# Patient Record
Sex: Female | Born: 1975 | Race: White | Hispanic: No | Marital: Married | Smoking: Current every day smoker
Health system: Southern US, Community
[De-identification: ages and names within clinical notes are randomized; demographics above are authoritative.]

## PROBLEM LIST (undated history)

## (undated) DIAGNOSIS — R112 Nausea with vomiting, unspecified: Secondary | ICD-10-CM

## (undated) DIAGNOSIS — F32A Depression, unspecified: Secondary | ICD-10-CM

## (undated) DIAGNOSIS — E038 Other specified hypothyroidism: Secondary | ICD-10-CM

## (undated) DIAGNOSIS — Z87442 Personal history of urinary calculi: Secondary | ICD-10-CM

## (undated) DIAGNOSIS — I10 Essential (primary) hypertension: Secondary | ICD-10-CM

## (undated) DIAGNOSIS — F419 Anxiety disorder, unspecified: Secondary | ICD-10-CM

## (undated) DIAGNOSIS — Z9889 Other specified postprocedural states: Secondary | ICD-10-CM

## (undated) DIAGNOSIS — E063 Autoimmune thyroiditis: Secondary | ICD-10-CM

## (undated) DIAGNOSIS — F329 Major depressive disorder, single episode, unspecified: Secondary | ICD-10-CM

## (undated) DIAGNOSIS — J189 Pneumonia, unspecified organism: Secondary | ICD-10-CM

## (undated) HISTORY — DX: Other specified hypothyroidism: E03.8

## (undated) HISTORY — DX: Anxiety disorder, unspecified: F41.9

## (undated) HISTORY — DX: Depression, unspecified: F32.A

## (undated) HISTORY — DX: Autoimmune thyroiditis: E06.3

## (undated) HISTORY — DX: Major depressive disorder, single episode, unspecified: F32.9

---

## 1998-01-18 HISTORY — PX: LAPAROSCOPIC OOPHERECTOMY: SHX6507

## 1998-01-18 HISTORY — PX: OVARIAN CYST SURGERY: SHX726

## 2011-01-05 ENCOUNTER — Other Ambulatory Visit: Payer: Self-pay | Admitting: Family Medicine

## 2011-02-08 ENCOUNTER — Ambulatory Visit: Payer: Self-pay | Admitting: Family Medicine

## 2012-04-01 ENCOUNTER — Emergency Department (HOSPITAL_COMMUNITY)
Admission: EM | Admit: 2012-04-01 | Discharge: 2012-04-01 | Disposition: A | Discharge status: Home / Self Care | Payer: BC Managed Care – PPO | Attending: Emergency Medicine | Admitting: Emergency Medicine

## 2012-04-01 ENCOUNTER — Encounter (HOSPITAL_COMMUNITY): Payer: Self-pay | Admitting: *Deleted

## 2012-04-01 DIAGNOSIS — J029 Acute pharyngitis, unspecified: Secondary | ICD-10-CM | POA: Insufficient documentation

## 2012-04-01 DIAGNOSIS — H6690 Otitis media, unspecified, unspecified ear: Secondary | ICD-10-CM | POA: Insufficient documentation

## 2012-04-01 DIAGNOSIS — R059 Cough, unspecified: Secondary | ICD-10-CM | POA: Insufficient documentation

## 2012-04-01 DIAGNOSIS — H5789 Other specified disorders of eye and adnexa: Secondary | ICD-10-CM | POA: Insufficient documentation

## 2012-04-01 DIAGNOSIS — J3489 Other specified disorders of nose and nasal sinuses: Secondary | ICD-10-CM | POA: Insufficient documentation

## 2012-04-01 DIAGNOSIS — J329 Chronic sinusitis, unspecified: Secondary | ICD-10-CM | POA: Insufficient documentation

## 2012-04-01 DIAGNOSIS — I10 Essential (primary) hypertension: Secondary | ICD-10-CM | POA: Insufficient documentation

## 2012-04-01 HISTORY — DX: Essential (primary) hypertension: I10

## 2012-04-01 MED ORDER — GUAIFENESIN-CODEINE 100-10 MG/5ML PO SYRP: 10.0000 mL | ORAL_SOLUTION | Freq: Three times a day (TID) | ORAL | Status: DC | PRN

## 2012-04-01 MED ORDER — AZITHROMYCIN 250 MG PO TABS
500.0000 mg | ORAL_TABLET | Freq: Once | ORAL | Status: AC
  Administered 2012-04-01: 500 mg via ORAL
  Filled 2012-04-01: qty 2

## 2012-04-01 MED ORDER — TOBRAMYCIN 0.3 % OP SOLN
1.0000 [drp] | Freq: Once | OPHTHALMIC | Status: AC
  Administered 2012-04-01: 1 [drp] via OPHTHALMIC
  Filled 2012-04-01: qty 5

## 2012-04-01 MED ORDER — GUAIFENESIN-CODEINE 100-10 MG/5ML PO SOLN
10.0000 mL | Freq: Once | ORAL | Status: AC
  Administered 2012-04-01: 10 mL via ORAL
  Filled 2012-04-01: qty 10

## 2012-04-01 MED ORDER — AZITHROMYCIN 250 MG PO TABS: ORAL_TABLET | ORAL | Status: DC

## 2012-04-01 MED ORDER — ANTIPYRINE-BENZOCAINE 5.4-1.4 % OT SOLN
3.0000 [drp] | Freq: Once | OTIC | Status: AC
  Administered 2012-04-01: 3 [drp] via OTIC
  Filled 2012-04-01: qty 10

## 2012-04-01 NOTE — ED Provider Notes (Signed)
History     CSN: [id number]  Arrival date & time 04/01/12  2021   First MD Initiated Contact with Patient 04/01/12 2031      Chief Complaint  Patient presents with  . Otalgia  . Sore Throat    (Consider location/radiation/quality/duration/timing/severity/associated sxs/prior treatment) Patient is a 37 y.o. female presenting with ear pain. The history is provided by the patient.  Otalgia Location:  Bilateral Behind ear:  No abnormality Quality:  Pressure and sore Severity:  Mild Onset quality:  Gradual Duration:  1 week Timing:  Constant Progression:  Worsening Chronicity:  New Context: not direct blow, not foreign body in ear, not loud noise and no water in ear   Relieved by:  Nothing Worsened by:  Nothing tried Ineffective treatments:  None tried Associated symptoms: congestion, cough, rhinorrhea and sore throat   Associated symptoms: no abdominal pain, no diarrhea, no ear discharge, no fever, no headaches, no hearing loss, no neck pain, no rash, no tinnitus and no vomiting   Cough:    Cough characteristics:  Productive   Sputum characteristics:  Yellow   Severity:  Mild   Onset quality:  Gradual   Progression:  Unchanged   Chronicity:  New   Past Medical History  Diagnosis Date  . Hypertension     Past Surgical History  Procedure Laterality Date  . Ovarian cyst surgery      History reviewed. No pertinent family history.  History  Substance Use Topics  . Smoking status: Never Smoker   . Smokeless tobacco: Not on file  . Alcohol Use: No    OB History   Grav Para Term Preterm Abortions TAB SAB Ect Mult Living                  Review of Systems  Constitutional: Negative for fever, chills, activity change and appetite change.  HENT: Positive for ear pain, congestion, sore throat, rhinorrhea and sinus pressure. Negative for hearing loss, facial swelling, trouble swallowing, neck pain, neck stiffness, tinnitus and ear discharge.   Eyes: Negative for  visual disturbance.  Respiratory: Positive for cough. Negative for chest tightness, shortness of breath, wheezing and stridor.   Cardiovascular: Negative for chest pain.  Gastrointestinal: Negative for nausea, vomiting, abdominal pain and diarrhea.  Skin: Negative.  Negative for rash.  Neurological: Negative for dizziness, weakness, numbness and headaches.  Hematological: Negative for adenopathy.  Psychiatric/Behavioral: Negative for confusion.  All other systems reviewed and are negative.    Allergies  Review of patient's allergies indicates no known allergies.  Home Medications  No current outpatient prescriptions on file.  BP 143/106  Pulse 92  Temp(Src) 98.1 F (36.7 C) (Oral)  Resp 20  Ht 5\' 4"  (1.626 m)  Wt 245 lb (111.131 kg)  BMI 42.03 kg/m2  SpO2 100%  LMP 03/25/2012  Physical Exam  Nursing note and vitals reviewed. Constitutional: She is oriented to person, place, and time. She appears well-developed and well-nourished. No distress.  HENT:  Head: Normocephalic and atraumatic. No trismus in the jaw.  Right Ear: Tympanic membrane and ear canal normal.  Left Ear: Ear canal normal. There is tenderness. No drainage or swelling. No mastoid tenderness. Tympanic membrane is erythematous. No hemotympanum.  Nose: Right sinus exhibits no maxillary sinus tenderness. Left sinus exhibits maxillary sinus tenderness.  Mouth/Throat: Uvula is midline and mucous membranes are normal. No edematous. Posterior oropharyngeal edema and posterior oropharyngeal erythema present. No oropharyngeal exudate or tonsillar abscesses.  Eyes: EOM are normal. Pupils are  equal, round, and reactive to light. Left eye exhibits exudate. Left eye exhibits no chemosis, no discharge and no hordeolum. No foreign body present in the left eye. Left conjunctiva is injected. Left conjunctiva has no hemorrhage.  Neck: Normal range of motion. Neck supple.  Cardiovascular: Normal rate, regular rhythm, normal heart  sounds and intact distal pulses.   No murmur heard. Pulmonary/Chest: Effort normal and breath sounds normal. No respiratory distress. She has no wheezes. She has no rales. She exhibits no tenderness.  Musculoskeletal: Normal range of motion.  Lymphadenopathy:    She has no cervical adenopathy.  Neurological: She is alert and oriented to person, place, and time. She exhibits normal muscle tone. Coordination normal.  Skin: Skin is warm and dry.    ED Course  Procedures (including critical care time)  Labs Reviewed - No data to display No results found.      MDM   Airway patent.  Vitals stable.  Left OM and likely sinusitis.  PCN allergy, so will treat with z-pack and robitussin AC.  Pt agrees to fluids, ibuprofen if needed for fever.  F/u with her PMD  Patient also c/o crusting and drainage to the left eye, tobramycin ophth drops dispensed along with auralgan otic  The patient appears reasonably screened and/or stabilized for discharge and I doubt any other medical condition or other [facility] requiring further screening, evaluation, or treatment in the ED at this time prior to discharge.      Judye Lorino L. Trisha Mangle, PA-C 04/05/12 1411

## 2012-04-01 NOTE — ED Notes (Signed)
C/o right and left earache, sore throat earlier this week and sinus pressure, denies N/V/D, + cough- productive yellow/ green phlegm, denies fever

## 2012-04-05 NOTE — ED Provider Notes (Signed)
Medical screening examination/treatment/procedure(s) were performed by non-physician practitioner and as supervising physician I was immediately available for consultation/collaboration.   Carleene Cooper III, MD 04/05/12 2007

## 2012-09-28 ENCOUNTER — Encounter (HOSPITAL_COMMUNITY): Payer: Self-pay | Admitting: Emergency Medicine

## 2012-09-28 ENCOUNTER — Emergency Department (HOSPITAL_COMMUNITY): Payer: BC Managed Care – PPO

## 2012-09-28 ENCOUNTER — Emergency Department (HOSPITAL_COMMUNITY)
Admission: EM | Admit: 2012-09-28 | Discharge: 2012-09-28 | Disposition: A | Discharge status: Home / Self Care | Payer: BC Managed Care – PPO | Attending: Emergency Medicine | Admitting: Emergency Medicine

## 2012-09-28 DIAGNOSIS — Y929 Unspecified place or not applicable: Secondary | ICD-10-CM | POA: Insufficient documentation

## 2012-09-28 DIAGNOSIS — X58XXXA Exposure to other specified factors, initial encounter: Secondary | ICD-10-CM | POA: Insufficient documentation

## 2012-09-28 DIAGNOSIS — IMO0002 Reserved for concepts with insufficient information to code with codable children: Secondary | ICD-10-CM | POA: Insufficient documentation

## 2012-09-28 DIAGNOSIS — Z79899 Other long term (current) drug therapy: Secondary | ICD-10-CM | POA: Insufficient documentation

## 2012-09-28 DIAGNOSIS — I10 Essential (primary) hypertension: Secondary | ICD-10-CM | POA: Insufficient documentation

## 2012-09-28 DIAGNOSIS — Y939 Activity, unspecified: Secondary | ICD-10-CM | POA: Insufficient documentation

## 2012-09-28 DIAGNOSIS — Z880 Allergy status to penicillin: Secondary | ICD-10-CM | POA: Insufficient documentation

## 2012-09-28 DIAGNOSIS — S46912A Strain of unspecified muscle, fascia and tendon at shoulder and upper arm level, left arm, initial encounter: Secondary | ICD-10-CM

## 2012-09-28 DIAGNOSIS — R000 Tachycardia, unspecified: Secondary | ICD-10-CM | POA: Insufficient documentation

## 2012-09-28 MED ORDER — DICLOFENAC SODIUM 75 MG PO TBEC: 75.0000 mg | DELAYED_RELEASE_TABLET | Freq: Two times a day (BID) | ORAL | Status: DC

## 2012-09-28 MED ORDER — KETOROLAC TROMETHAMINE 10 MG PO TABS
10.0000 mg | ORAL_TABLET | Freq: Once | ORAL | Status: AC
  Administered 2012-09-28: 10 mg via ORAL
  Filled 2012-09-28: qty 1

## 2012-09-28 MED ORDER — ONDANSETRON HCL 4 MG PO TABS
4.0000 mg | ORAL_TABLET | Freq: Once | ORAL | Status: AC
  Administered 2012-09-28: 4 mg via ORAL
  Filled 2012-09-28: qty 1

## 2012-09-28 MED ORDER — HYDROCODONE-ACETAMINOPHEN 5-325 MG PO TABS
2.0000 | ORAL_TABLET | Freq: Once | ORAL | Status: AC
  Administered 2012-09-28: 2 via ORAL
  Filled 2012-09-28: qty 2

## 2012-09-28 MED ORDER — HYDROCODONE-ACETAMINOPHEN 5-325 MG PO TABS: 1.0000 | ORAL_TABLET | ORAL | Status: DC | PRN

## 2012-09-28 NOTE — ED Provider Notes (Signed)
CSN: [id number]     Arrival date & time 09/28/12  2111 History   First MD Initiated Contact with Patient 09/28/12 2251     Chief Complaint  Patient presents with  . Shoulder Pain   (Consider location/radiation/quality/duration/timing/severity/associated sxs/prior Treatment) HPI Comments: Patient states she's been having problems with her shoulder for approximately a week and a half. The pain got progressively worse today around 5 PM. Patient states she can hardly raise her arm without severe pain in the shoulder region and going through to her back. The patient denies any recent injury or trauma. There's been no recent operations. The patient has not been dropping objects. She has tried ibuprofen with only partial relief of her pain and discomfort. She presents now for evaluation concerning the shoulder pain.  Patient is a 37 y.o. female presenting with shoulder pain. The history is provided by the patient.  Shoulder Pain Pertinent negatives include no abdominal pain, arthralgias, chest pain, coughing or neck pain.    Past Medical History  Diagnosis Date  . Hypertension    Past Surgical History  Procedure Laterality Date  . Ovarian cyst surgery     No family history on file. History  Substance Use Topics  . Smoking status: Never Smoker   . Smokeless tobacco: Not on file  . Alcohol Use: No   OB History   Grav Para Term Preterm Abortions TAB SAB Ect Mult Living                 Review of Systems  Constitutional: Negative for activity change.       All ROS Neg except as noted in HPI  HENT: Negative for nosebleeds and neck pain.   Eyes: Negative for photophobia and discharge.  Respiratory: Negative for cough, shortness of breath and wheezing.   Cardiovascular: Negative for chest pain and palpitations.  Gastrointestinal: Negative for abdominal pain and blood in stool.  Genitourinary: Negative for dysuria, frequency and hematuria.  Musculoskeletal: Negative for back pain and  arthralgias.  Skin: Negative.   Neurological: Negative for dizziness, seizures and speech difficulty.  Psychiatric/Behavioral: Negative for hallucinations and confusion.    Allergies  Penicillins  Home Medications   Current Outpatient Rx  Name  Route  Sig  Dispense  Refill  . hydrochlorothiazide (HYDRODIURIL) 25 MG tablet   Oral   Take 25 mg by mouth daily.         Marland Kitchen levothyroxine (SYNTHROID, LEVOTHROID) 25 MCG tablet   Oral   Take 25 mcg by mouth daily before breakfast.         . metoprolol (LOPRESSOR) 50 MG tablet   Oral   Take 50 mg by mouth daily.         . sertraline (ZOLOFT) 100 MG tablet   Oral   Take 100 mg by mouth daily.         . diclofenac (VOLTAREN) 75 MG EC tablet   Oral   Take 1 tablet (75 mg total) by mouth 2 (two) times daily.   12 tablet   0   . HYDROcodone-acetaminophen (NORCO/VICODIN) 5-325 MG per tablet   Oral   Take 1 tablet by mouth every 4 (four) hours as needed for pain.   20 tablet   0    BP 151/119  Pulse 109  Temp(Src) 99.1 F (37.3 C) (Oral)  Resp 18  Ht 5\' 3"  (1.6 m)  Wt 228 lb (103.42 kg)  BMI 40.4 kg/m2  SpO2 97%  LMP 09/20/2012 Physical Exam  Nursing note and vitals reviewed. Constitutional: She is oriented to person, place, and time. She appears well-developed and well-nourished.  Non-toxic appearance.  HENT:  Head: Normocephalic.  Right Ear: Tympanic membrane and external ear normal.  Left Ear: Tympanic membrane and external ear normal.  Eyes: EOM and lids are normal. Pupils are equal, round, and reactive to light.  Neck: Normal range of motion. Neck supple. Carotid bruit is not present.  Cardiovascular: Regular rhythm, normal heart sounds, intact distal pulses and normal pulses.  Tachycardia present.   Pulmonary/Chest: Breath sounds normal. No respiratory distress.  Abdominal: Soft. Bowel sounds are normal. There is no tenderness. There is no guarding.  Musculoskeletal: Normal range of motion.  There is pain  with attempted range of motion of the left shoulder. There is pain with both aDduction and abduction. There is soreness of the posterior shoulder from the upper trapezius to just below the scapula. There no hot areas involving the shoulder.  There is full range of motion of the left elbow wrist and fingers. Capillary refill is less than 2 seconds.  Lymphadenopathy:       Head (right side): No submandibular adenopathy present.       Head (left side): No submandibular adenopathy present.    She has no cervical adenopathy.  Neurological: She is alert and oriented to person, place, and time. She has normal strength. No cranial nerve deficit or sensory deficit.  Skin: Skin is warm and dry.  Psychiatric: She has a normal mood and affect. Her speech is normal.    ED Course  Procedures (including critical care time) Labs Review Labs Reviewed - No data to display Imaging Review Dg Shoulder Left  09/28/2012   *RADIOLOGY REPORT*  Clinical Data: Older pain without injury.  LEFT SHOULDER - 2+ VIEW  Comparison: None.  Findings: No acute fracture or dislocation is noted.  No gross soft tissue abnormality is seen.  IMPRESSION: No acute abnormality noted.   Original Report Authenticated By: Alcide Clever, M.D.    MDM   1. Shoulder strain, left, initial encounter    **I have reviewed nursing notes, vital signs, and all appropriate lab and imaging results for this patient.* Patient states she's been having problems with her shoulder for approximately a week and a half. The pain got progressively worse today around 5 PM. She has both anterior and posterior shoulder pain on the left. Examination is suspicious for possible rotator cuff injury.  Patient is fitted with a arm sling. Prescription for diclofenac and Norco given to the patient. Patient will see orthopedics next week.   Kathie Dike, PA-C 09/28/12 2324

## 2012-09-28 NOTE — ED Notes (Signed)
Pt alert & oriented x4, stable gait. Patient given discharge instructions, paperwork & prescription(s). Patient  instructed to stop at the registration desk to finish any additional paperwork. Patient verbalized understanding. Pt left department w/ no further questions. 

## 2012-09-28 NOTE — ED Notes (Signed)
Patient c/o left shoulder pain since 5pm.  Patient denies any injury or trauma.

## 2012-10-02 NOTE — ED Provider Notes (Signed)
Medical screening examination/treatment/procedure(s) were performed by non-physician practitioner and as supervising physician I was immediately available for consultation/collaboration.  Geoffery Lyons, MD 10/02/12 1538

## 2012-10-12 ENCOUNTER — Ambulatory Visit (INDEPENDENT_AMBULATORY_CARE_PROVIDER_SITE_OTHER): Payer: BC Managed Care – PPO | Admitting: Otolaryngology

## 2012-10-12 DIAGNOSIS — J0100 Acute maxillary sinusitis, unspecified: Secondary | ICD-10-CM

## 2012-10-12 DIAGNOSIS — J310 Chronic rhinitis: Secondary | ICD-10-CM

## 2012-10-16 ENCOUNTER — Other Ambulatory Visit (INDEPENDENT_AMBULATORY_CARE_PROVIDER_SITE_OTHER): Payer: Self-pay | Admitting: Otolaryngology

## 2012-10-16 DIAGNOSIS — J329 Chronic sinusitis, unspecified: Secondary | ICD-10-CM

## 2012-10-19 ENCOUNTER — Other Ambulatory Visit (HOSPITAL_COMMUNITY): Payer: BC Managed Care – PPO

## 2012-10-20 ENCOUNTER — Ambulatory Visit (HOSPITAL_COMMUNITY)
Admission: RE | Admit: 2012-10-20 | Discharge: 2012-10-20 | Disposition: A | Discharge status: Home / Self Care | Payer: BC Managed Care – PPO | Source: Ambulatory Visit | Attending: Otolaryngology | Admitting: Otolaryngology

## 2012-10-20 DIAGNOSIS — R51 Headache: Secondary | ICD-10-CM | POA: Insufficient documentation

## 2012-10-20 DIAGNOSIS — J329 Chronic sinusitis, unspecified: Secondary | ICD-10-CM | POA: Insufficient documentation

## 2012-10-20 DIAGNOSIS — J3489 Other specified disorders of nose and nasal sinuses: Secondary | ICD-10-CM | POA: Insufficient documentation

## 2012-11-19 ENCOUNTER — Emergency Department (HOSPITAL_COMMUNITY)
Admission: EM | Admit: 2012-11-19 | Discharge: 2012-11-19 | Discharge status: Against Medical Advice | Payer: BC Managed Care – PPO | Attending: Emergency Medicine | Admitting: Emergency Medicine

## 2012-11-19 ENCOUNTER — Encounter (HOSPITAL_COMMUNITY): Payer: Self-pay | Admitting: Emergency Medicine

## 2012-11-19 DIAGNOSIS — R109 Unspecified abdominal pain: Secondary | ICD-10-CM | POA: Insufficient documentation

## 2012-11-19 DIAGNOSIS — I10 Essential (primary) hypertension: Secondary | ICD-10-CM | POA: Insufficient documentation

## 2012-11-19 NOTE — ED Notes (Signed)
No answer in triage waiting room.

## 2012-11-19 NOTE — ED Notes (Signed)
No answer in waiting room 

## 2012-11-19 NOTE — ED Notes (Signed)
Pt c/o left flank pain that started this am, denies any injury,

## 2013-08-28 ENCOUNTER — Other Ambulatory Visit (HOSPITAL_COMMUNITY): Payer: Self-pay | Admitting: Physician Assistant

## 2013-08-28 DIAGNOSIS — N6310 Unspecified lump in the right breast, unspecified quadrant: Secondary | ICD-10-CM

## 2013-09-11 ENCOUNTER — Other Ambulatory Visit (HOSPITAL_COMMUNITY): Payer: Self-pay | Admitting: Physician Assistant

## 2013-09-11 ENCOUNTER — Ambulatory Visit (HOSPITAL_COMMUNITY)
Admission: RE | Admit: 2013-09-11 | Discharge: 2013-09-11 | Disposition: A | Discharge status: Home / Self Care | Payer: BC Managed Care – PPO | Source: Ambulatory Visit | Attending: Physician Assistant | Admitting: Physician Assistant

## 2013-09-11 DIAGNOSIS — N63 Unspecified lump in unspecified breast: Secondary | ICD-10-CM | POA: Diagnosis not present

## 2013-09-11 DIAGNOSIS — N6310 Unspecified lump in the right breast, unspecified quadrant: Secondary | ICD-10-CM

## 2013-09-18 ENCOUNTER — Encounter (HOSPITAL_COMMUNITY): Payer: Self-pay

## 2013-09-18 ENCOUNTER — Ambulatory Visit (HOSPITAL_COMMUNITY)
Admission: RE | Admit: 2013-09-18 | Discharge: 2013-09-18 | Disposition: A | Discharge status: Home / Self Care | Payer: BC Managed Care – PPO | Source: Ambulatory Visit | Attending: Physician Assistant | Admitting: Physician Assistant

## 2013-09-18 ENCOUNTER — Other Ambulatory Visit (HOSPITAL_COMMUNITY): Payer: Self-pay | Admitting: Physician Assistant

## 2013-09-18 DIAGNOSIS — N6310 Unspecified lump in the right breast, unspecified quadrant: Secondary | ICD-10-CM

## 2013-09-18 DIAGNOSIS — N63 Unspecified lump in unspecified breast: Secondary | ICD-10-CM | POA: Diagnosis not present

## 2013-09-18 MED ORDER — LIDOCAINE HCL (PF) 2 % IJ SOLN: 10.0000 mL | Freq: Once | INTRAMUSCULAR | Status: AC

## 2013-09-18 MED ORDER — LIDOCAINE HCL (PF) 2 % IJ SOLN
INTRAMUSCULAR | Status: AC
  Administered 2013-09-18: 10 mL
  Filled 2013-09-18: qty 10

## 2013-09-18 NOTE — Discharge Instructions (Signed)
Breast Biopsy °Care After °These instructions give you information on caring for yourself after your procedure. Your doctor may also give you more specific instructions. Call your doctor if you have any problems or questions after your procedure. °HOME CARE °· Only take medicine as told by your doctor. °· Do not take aspirin. °· Keep your sutures (stitches) dry when bathing. °· Protect the biopsy area. Do not let the area get bumped. °· Avoid activities that could pull the biopsy site open until your doctor approves. This includes: °· Stretching. °· Reaching. °· Exercise. °· Sports. °· Lifting more than 3lb. °· Continue your normal diet. °· Wear a good support bra for as long as told by your doctor. °· Change any bandages (dressings) as told by your doctor. °· Do not drink alcohol while taking pain medicine. °· Keep all doctor visits as told. Ask when your test results will be ready. Make sure you get your test results. °GET HELP RIGHT AWAY IF:  °· You have a fever. °· You have more bleeding (more than a small spot) from the biopsy site. °· You have trouble breathing. °· You have yellowish-white fluid (pus) coming from the biopsy site. °· You have redness, puffiness (swelling), or more pain in the biopsy site. °· You have a bad smell coming from the biopsy site. °· Your biopsy site opens after sutures, staples, or sticky strips have been removed. °· You have a rash. °· You need stronger medicine. °MAKE SURE YOU: °· Understand these instructions. °· Will watch your condition. °· Will get help right away if you are not doing well or get worse. °Document Released: 10/31/2008 Document Revised: 03/29/2011 Document Reviewed: 02/14/2011 °Exit[employer]® Patient Information ©2015 [employer]. This information is not intended to replace advice given to you by your health care provider. Make sure you discuss any questions you have with your health care provider. ° °Breast Biopsy °A breast biopsy is a test during which a sample of  tissue is taken from your breast. The breast tissue is looked at under a microscope for cancer cells.  °BEFORE THE PROCEDURE °· Make plans to have someone drive you home after the test. °· Do not smoke for 2 weeks before the test. Stop smoking, if you smoke. °· Do not drink alcohol for 24 hours before the test. °· Wear a good support bra to the test. °PROCEDURE  °You may be given one of the following: °· A medicine to numb the breast area (local anesthetic). °· A medicine to make you fall asleep (general anesthetic). °There are different types of breast biopsies. They include: °· Fine-needle aspiration. °¨ A needle is put into the breast lump. °¨ The needle takes out fluid and cells from the lump. °¨ Ultrasound imaging may be used to help find the lump and to put the needle in the right spot. °· Core-needle biopsy. °¨ A needle is put into the breast lump. °¨ The needle is put in your breast 3-6 times. °¨ The needle removes breast tissue. °¨ An ultrasound image or X-ray is often used to find the right spot to put in the needle. °· Stereotactic biopsy. °¨ X-rays and a computer are used to study X-ray pictures of the breast lump. °¨ The computer finds where the needle needs to be put into the breast. °¨ Tissue samples are taken out. °· Vacuum-assisted biopsy. °¨ A small cut (incision) is made in your breast. °¨ A biopsy device is put through the cut and into the breast   tissue. °¨ The biopsy device draws abnormal breast tissue into the biopsy device. °¨ A large tissue sample is often removed. °¨ No stitches are needed. °· Ultrasound-guided core-needle biopsy. °¨ Ultrasound imaging helps guide the needle into the area of the breast that is not normal. °¨ A cut is made in the breast. The needle is put into the breast lump. °¨ Tissue samples are taken out. °· Open biopsy. °¨ A large cut is made in the breast. °¨ Your doctor will try to remove the whole breast lump or as much as possible. °All tissue, fluid, or cell samples  are looked at under a microscope.  °AFTER THE PROCEDURE °· You will be taken to an area to recover. You will be able to go home once you are doing well and are without problems. °· You may have bruising on your breast. This is normal. °· A pressure bandage (dressing) may be put on your breast for 24-48 hours. This type of bandage is wrapped tightly around your chest. It helps stop fluid from building up underneath tissues. °Document Released: 03/29/2011 Document Revised: 05/21/2013 Document Reviewed: 03/29/2011 °ExitCare® Patient Information ©2015 [employer]. This information is not intended to replace advice given to you by your health care provider. Make sure you discuss any questions you have with your health care provider. ° °

## 2015-03-01 ENCOUNTER — Encounter (HOSPITAL_COMMUNITY): Payer: Self-pay

## 2015-03-01 ENCOUNTER — Emergency Department (HOSPITAL_COMMUNITY): Payer: No Typology Code available for payment source

## 2015-03-01 ENCOUNTER — Emergency Department (HOSPITAL_COMMUNITY)
Admission: EM | Admit: 2015-03-01 | Discharge: 2015-03-02 | Disposition: A | Discharge status: Home / Self Care | Payer: No Typology Code available for payment source | Attending: Emergency Medicine | Admitting: Emergency Medicine

## 2015-03-01 DIAGNOSIS — J181 Lobar pneumonia, unspecified organism: Secondary | ICD-10-CM

## 2015-03-01 DIAGNOSIS — Z87442 Personal history of urinary calculi: Secondary | ICD-10-CM | POA: Insufficient documentation

## 2015-03-01 DIAGNOSIS — M791 Myalgia: Secondary | ICD-10-CM | POA: Insufficient documentation

## 2015-03-01 DIAGNOSIS — Z79899 Other long term (current) drug therapy: Secondary | ICD-10-CM | POA: Diagnosis not present

## 2015-03-01 DIAGNOSIS — Z880 Allergy status to penicillin: Secondary | ICD-10-CM | POA: Diagnosis not present

## 2015-03-01 DIAGNOSIS — H578 Other specified disorders of eye and adnexa: Secondary | ICD-10-CM | POA: Insufficient documentation

## 2015-03-01 DIAGNOSIS — J189 Pneumonia, unspecified organism: Secondary | ICD-10-CM | POA: Diagnosis not present

## 2015-03-01 DIAGNOSIS — R000 Tachycardia, unspecified: Secondary | ICD-10-CM | POA: Diagnosis not present

## 2015-03-01 DIAGNOSIS — R110 Nausea: Secondary | ICD-10-CM | POA: Diagnosis not present

## 2015-03-01 DIAGNOSIS — R0989 Other specified symptoms and signs involving the circulatory and respiratory systems: Secondary | ICD-10-CM | POA: Diagnosis not present

## 2015-03-01 DIAGNOSIS — R509 Fever, unspecified: Secondary | ICD-10-CM | POA: Diagnosis present

## 2015-03-01 DIAGNOSIS — I10 Essential (primary) hypertension: Secondary | ICD-10-CM | POA: Diagnosis not present

## 2015-03-01 MED ORDER — IPRATROPIUM-ALBUTEROL 0.5-2.5 (3) MG/3ML IN SOLN
3.0000 mL | Freq: Once | RESPIRATORY_TRACT | Status: AC
  Administered 2015-03-01: 3 mL via RESPIRATORY_TRACT
  Filled 2015-03-01: qty 3

## 2015-03-01 MED ORDER — HYDROCODONE-ACETAMINOPHEN 5-325 MG PO TABS: 1.0000 | ORAL_TABLET | Freq: Four times a day (QID) | ORAL | Status: DC | PRN

## 2015-03-01 MED ORDER — HYDROCOD POLST-CPM POLST ER 10-8 MG/5ML PO SUER
5.0000 mL | Freq: Once | ORAL | Status: AC
  Administered 2015-03-01: 5 mL via ORAL
  Filled 2015-03-01: qty 5

## 2015-03-01 MED ORDER — AZITHROMYCIN 250 MG PO TABS: ORAL_TABLET | ORAL | Status: DC

## 2015-03-01 MED ORDER — AZITHROMYCIN 250 MG PO TABS
500.0000 mg | ORAL_TABLET | Freq: Once | ORAL | Status: AC
  Administered 2015-03-02: 500 mg via ORAL
  Filled 2015-03-01: qty 2

## 2015-03-01 MED ORDER — ONDANSETRON 4 MG PO TBDP
4.0000 mg | ORAL_TABLET | Freq: Once | ORAL | Status: AC
Start: 2015-03-02 — End: 2015-03-02
  Administered 2015-03-02: 4 mg via ORAL
  Filled 2015-03-01: qty 1

## 2015-03-01 MED ORDER — ALBUTEROL SULFATE HFA 108 (90 BASE) MCG/ACT IN AERS
2.0000 | INHALATION_SPRAY | RESPIRATORY_TRACT | Status: DC | PRN
  Administered 2015-03-02: 2 via RESPIRATORY_TRACT
  Filled 2015-03-01: qty 6.7

## 2015-03-01 NOTE — ED Notes (Signed)
Hot and cold, head is pounding, I do not feel good at all. I think I have been running a fever per pt. Nauseated per pt. Aching all over.

## 2015-03-01 NOTE — ED Provider Notes (Signed)
CSN: [id number]     Arrival date & time 03/01/15  2054 History   First MD Initiated Contact with Patient 03/01/15 2236     Chief Complaint  Patient presents with  . Fever  . Generalized Body Aches     (Consider location/radiation/quality/duration/timing/severity/associated sxs/prior Treatment) Patient is a 40 y.o. female presenting with URI. The history is provided by the patient.  URI Presenting symptoms: congestion, cough, facial pain, fever and sore throat   Severity:  Moderate Onset quality:  Gradual Duration:  2 weeks Timing:  Constant Progression:  Worsening Chronicity:  New Relieved by:  Nothing Worsened by:  Nothing tried Ineffective treatments:  OTC medications Associated symptoms: headaches, myalgias, sinus pain and wheezing   Risk factors: sick contacts    Kristen Atkins is a 40 y.o. female who presents to the ED with cough, congestion, fever, chills, sore throat and aching all over. Her symptoms started 2 weeks ago and she went to her PCP and was Dx with sinus infection and given Rx for Amoxicillin, patient called back and told PCP that she is allergic to penicillin. She then called in another medication but the patient states she did not have the money to get it so she has had no antibiotic. Patient has been taking tylenol and ibuprofen without relief. Patient's husband has been sick with similar symptoms.   Past Medical History  Diagnosis Date  . Hypertension   . Kidney stones    Past Surgical History  Procedure Laterality Date  . Ovarian cyst surgery     No family history on file. Social History  Substance Use Topics  . Smoking status: Never Smoker   . Smokeless tobacco: None  . Alcohol Use: No   OB History    No data available     Review of Systems  Constitutional: Positive for fever and chills.  HENT: Positive for congestion and sore throat.   Eyes: Positive for redness and itching. Negative for photophobia and visual disturbance.  Respiratory:  Positive for cough and wheezing.   Gastrointestinal: Positive for nausea. Negative for vomiting and diarrhea.  Genitourinary: Negative for dysuria, urgency, frequency and decreased urine volume.  Musculoskeletal: Positive for myalgias.  Skin: Negative for rash.  Neurological: Positive for headaches.  Psychiatric/Behavioral: Negative for confusion. The patient is not nervous/anxious.       Allergies  Penicillins  Home Medications   Prior to Admission medications   Medication Sig Start Date End Date Taking? Authorizing Provider  acetaminophen (TYLENOL) 500 MG tablet Take 2,000 mg by mouth every 6 (six) hours as needed for mild pain.   Yes Historical Provider, MD  fluticasone (FLONASE) 50 MCG/ACT nasal spray Place 1 spray into both nostrils daily as needed for allergies or rhinitis.   Yes Historical Provider, MD  hydrochlorothiazide (HYDRODIURIL) 25 MG tablet Take 25 mg by mouth daily.   Yes Historical Provider, MD  ibuprofen (ADVIL,MOTRIN) 200 MG tablet Take 800 mg by mouth every 6 (six) hours as needed for headache.   Yes Historical Provider, MD  levothyroxine (SYNTHROID, LEVOTHROID) 150 MCG tablet Take 150 mcg by mouth daily before breakfast.   Yes Historical Provider, MD  metoprolol (LOPRESSOR) 50 MG tablet Take 50 mg by mouth daily.   Yes Historical Provider, MD  azithromycin (ZITHROMAX) 250 MG tablet Take 1 tablet every day until finished. 03/01/15   Hope Bunnie Pion, NP  HYDROcodone-acetaminophen (NORCO/VICODIN) 5-325 MG tablet Take 1 tablet by mouth every 6 (six) hours as needed (for pain or cough).  03/01/15   Hope Bunnie Pion, NP   BP 120/65 mmHg  Pulse 92  Temp(Src) 100.2 F (37.9 C) (Oral)  Resp 16  Ht 5\' 3"  (1.6 m)  Wt 106.142 kg  BMI 41.46 kg/m2  SpO2 98%  LMP 02/27/2015 Physical Exam  Constitutional: She is oriented to person, place, and time. She appears well-developed and well-nourished. No distress.  HENT:  Head: Normocephalic and atraumatic.  Nose: Mucosal edema present.  Right sinus exhibits maxillary sinus tenderness. Left sinus exhibits maxillary sinus tenderness.  Eyes: EOM are normal.  Neck: Normal range of motion. Neck supple.  Cardiovascular: Regular rhythm.  Tachycardia present.   Pulmonary/Chest: Effort normal. Wheezes: occasional. She has rales in the left lower field.  Abdominal: Soft. Bowel sounds are normal. There is no tenderness.  Musculoskeletal: Normal range of motion.  Lymphadenopathy:    She has no cervical adenopathy.  Neurological: She is alert and oriented to person, place, and time. No cranial nerve deficit.  Skin: Skin is warm and dry.  Psychiatric: She has a normal mood and affect. Her behavior is normal.  Nursing note and vitals reviewed.   ED Course  Procedures (including critical care time) Labs Review Labs Reviewed - No data to display  Imaging Review Dg Chest 2 View  03/01/2015  CLINICAL DATA:  Acute onset of productive cough, shortness of breath and generalized chest pain. Dizziness. Fever, nausea and body aches. Initial encounter. EXAM: CHEST  2 VIEW COMPARISON:  None. FINDINGS: The lungs are well-aerated. Left basilar airspace opacity raises concern for pneumonia. Mild vascular congestion is noted. There is no evidence of pleural effusion or pneumothorax. The heart is normal in size; the mediastinal contour is within normal limits. No acute osseous abnormalities are seen. IMPRESSION: Left basilar airspace opacity raises concern for pneumonia. Mild vascular congestion noted. Electronically Signed   By: Garald Balding M.D.   On: 03/01/2015 23:41    MDM  40 y.o. female with 2 week hx of cough, congestion, fever and chills stable for d/c and does not appear toxic. Improved with albuterol/atrovent neb treatment and Tussionex. Will treat with antibiotics for pneumonia. First dose of Zithromax given prior to d/c and albuterol inhaler to go home with patient. Discussed with the patient clinical and  X-ray findings and plan of care  and all questioned fully answered. She will return if any problems arise.   Final diagnoses:  Left lower lobe pneumonia        Ashley Murrain, NP 03/02/15 [id number]  Sherwood Gambler, MD 03/02/15 [phone]

## 2015-03-02 NOTE — Discharge Instructions (Signed)
Do not take the narcotic if driving as it will make you sleepy. Follow up with your doctor or return here for worsening symptoms.   Community-Acquired Pneumonia, Adult Pneumonia is an infection of the lungs. One type of pneumonia can happen while a person is in a hospital. A different type can happen when a person is not in a hospital (community-acquired pneumonia). It is easy for this kind to spread from person to person. It can spread to you if you breathe near an infected person who coughs or sneezes. Some symptoms include:  A dry cough.  A wet (productive) cough.  Fever.  Sweating.  Chest pain. HOME CARE  Take over-the-counter and prescription medicines only as told by your doctor.  Only take cough medicine if you are losing sleep.  If you were prescribed an antibiotic medicine, take it as told by your doctor. Do not stop taking the antibiotic even if you start to feel better.  Sleep with your head and neck raised (elevated). You can do this by putting a few pillows under your head, or you can sleep in a recliner.  Do not use tobacco products. These include cigarettes, chewing tobacco, and e-cigarettes. If you need help quitting, ask your doctor.  Drink enough water to keep your pee (urine) clear or pale yellow. A shot (vaccine) can help prevent pneumonia. Shots are often suggested for:  People older than 38 years of age.  People older than 38 years of age:  Who are having cancer treatment.  Who have long-term (chronic) lung disease.  Who have problems with their body's defense system (immune system). You may also prevent pneumonia if you take these actions:  Get the flu (influenza) shot every year.  Go to the dentist as often as told.  Wash your hands often. If soap and water are not available, use hand sanitizer. GET HELP IF:  You have a fever.  You lose sleep because your cough medicine does not help. GET HELP RIGHT AWAY IF:  You are short of breath and it  gets worse.  You have more chest pain.  Your sickness gets worse. This is very serious if:  You are an older adult.  Your body's defense system is weak.  You cough up blood.   This information is not intended to replace advice given to you by your health care provider. Make sure you discuss any questions you have with your health care provider.   Document Released: 06/23/2007 Document Revised: 09/25/2014 Document Reviewed: 05/01/2014 Elsevier Interactive Patient Education [employer].

## 2016-07-05 ENCOUNTER — Encounter: Payer: Self-pay | Admitting: Gastroenterology

## 2016-08-26 ENCOUNTER — Ambulatory Visit: Payer: No Typology Code available for payment source | Admitting: Nurse Practitioner

## 2016-09-15 ENCOUNTER — Ambulatory Visit (INDEPENDENT_AMBULATORY_CARE_PROVIDER_SITE_OTHER): Payer: Managed Care, Other (non HMO) | Admitting: Nurse Practitioner

## 2016-09-15 ENCOUNTER — Encounter: Payer: Self-pay | Admitting: Nurse Practitioner

## 2016-09-15 DIAGNOSIS — R197 Diarrhea, unspecified: Secondary | ICD-10-CM | POA: Diagnosis not present

## 2016-09-15 NOTE — Patient Instructions (Signed)
1. The taking her medications. 2. Continue to avoid sodas and increase her water. 3. Return for follow-up in 3 months. 4. Call us if you have any persons or concerns before then.

## 2016-09-15 NOTE — Progress Notes (Signed)
Primary Care Physician:  Barry Dienes, NP Primary Gastroenterologist:  Dr. Oneida Alar  Chief Complaint  Patient presents with  . Diarrhea    doing better since taking Align and cut back on [place]. Dew    HPI:   Kristen Atkins is a 41 y.o. female who presents on referral from PCP for Diarrhea, question celiac disease versus IBS. Patient last saw primary care on 06/21/2016 for abdominal pain. At that time she stated her abdominal pain lasted for 2 weeks and was associated with intermittent diarrhea. Her abdominal pain improves after bowel movement. Denies constipation or nausea but did have one episode of vomiting. History of anxiety which she states well controlled. Labs ordered include CBC which showed no leukocytosis, normal hemoglobin at 15.4 celiac antibody mostly normal with a mildly elevated IgA at 372 (high normal 352). TSH was elevated at 8.440. CMP normal.  Today she states she's doing ok overall. She was having diarrhea when she was drinking a lot of [facility]. She is now drinking more water as well as trying Align probiotic, and this has resolved the diarrhea. Only time her abdomen hurts is when she's under a lot of stress and she has recently an antianxiety medication (Buspar) and was just started yesterday. Denies hematochezia, melena, N/V, fever, chills, unintentional weight loss, acute changes in bowel habits (other than resolution of constipation with dietary changes.) Denies chest pain, dyspnea, dizziness, lightheadedness, syncope, near syncope. Denies any other upper or lower GI symptoms.   Past Medical History:  Diagnosis Date  . Hypertension   . Kidney stones     Past Surgical History:  Procedure Laterality Date  . OVARIAN CYST SURGERY      Current Outpatient Prescriptions  Medication Sig Dispense Refill  . busPIRone (BUSPAR) 10 MG tablet Take 10 mg by mouth 2 (two) times daily.    Marland Kitchen CLONAZEPAM PO Take by mouth 2 (two) times daily. Pt unsure of strength    .  fluticasone (FLONASE) 50 MCG/ACT nasal spray Place 1 spray into both nostrils daily as needed for allergies or rhinitis.    . hydrochlorothiazide (HYDRODIURIL) 25 MG tablet Take 25 mg by mouth daily.    Marland Kitchen ibuprofen (ADVIL,MOTRIN) 200 MG tablet Take 800 mg by mouth every 6 (six) hours as needed for headache.    . levothyroxine (SYNTHROID, LEVOTHROID) 150 MCG tablet Take 150 mcg by mouth daily before breakfast.    . metoprolol (LOPRESSOR) 50 MG tablet Take 50 mg by mouth daily.     No current facility-administered medications for this visit.     Allergies as of 09/15/2016 - Review Complete 09/15/2016  Allergen Reaction Noted  . Penicillins Hives and Other (See Comments) 04/01/2012    Family History  Problem Relation Age of Onset  . Ovarian cancer Mother   . Colon cancer Neg Hx     Social History   Social History  . Marital status: Married    Spouse name: N/A  . Number of children: N/A  . Years of education: N/A   Occupational History  . Not on file.   Social History Main Topics  . Smoking status: Current Every Day Smoker    Packs/day: 0.50    Years: 6.00    Types: Cigarettes    Start date: 09/16/2010  . Smokeless tobacco: Never Used  . Alcohol use No  . Drug use: No  . Sexual activity: Not on file   Other Topics Concern  . Not on file   Social  History Narrative  . No narrative on file    Review of Systems: Complete ROS negative except as per HPI.    Physical Exam: BP (!) 146/105   Pulse 100   Temp 98.7 F (37.1 C) (Oral)   Ht 5\' 4"  (1.626 m)   Wt 243 lb (110.2 kg)   LMP 09/12/2016 (Exact Date)   BMI 41.71 kg/m  General:   Alert and oriented. Pleasant and cooperative. Well-nourished and well-developed.  Head:  Normocephalic and atraumatic. Eyes:  Without icterus, sclera clear and conjunctiva pink.  Ears:  Normal auditory acuity. Cardiovascular:  S1, S2 present without murmurs appreciated. Extremities without clubbing or edema. Respiratory:  Clear to  auscultation bilaterally. No wheezes, rales, or rhonchi. No distress.  Gastrointestinal:  +BS, rounded but soft, non-tender and non-distended. No HSM noted. No guarding or rebound. No masses appreciated.  Rectal:  Deferred  Musculoskalatal:  Symmetrical without gross deformities. Neurologic:  Alert and oriented x4;  grossly normal neurologically. Psych:  Alert and cooperative. Normal mood and affect. Heme/Lymph/Immune: No excessive bruising noted.    09/15/2016 3:52 PM   Disclaimer: This note was dictated with voice recognition software. Similar sounding words can inadvertently be transcribed and may not be corrected upon review.

## 2016-09-15 NOTE — Assessment & Plan Note (Signed)
The patient was referred for diarrhea with multiple possible etiologies including IBS with a history of stress and anxiety, celiac disease with a nonspecific elevation in one element of her celiac antibody panel, and thyroid mediated with an elevated TSH. However, the patient may dietary changes and specifically significantly reduce the amount of [facility] she is drinking an increased amount of water she is drinking. Since then her diarrhea has resolved. Starting align probiotic has also helped. At this point I recommend she continue her align, continue significant reduction and potentially cessation of sodas and increased water intake. Return for follow-up in 3 months. If she continues to do well she can likely be put on a "as needed" follow-up schedule. I have requested that she call with any questions, concerns, recurrent symptoms before her follow-up visit.

## 2016-09-16 ENCOUNTER — Encounter: Payer: Self-pay | Admitting: Gastroenterology

## 2016-09-16 NOTE — Progress Notes (Signed)
cc'ed to pcp °

## 2016-12-23 ENCOUNTER — Ambulatory Visit: Payer: No Typology Code available for payment source | Admitting: Nurse Practitioner

## 2017-06-14 ENCOUNTER — Encounter: Payer: Self-pay | Admitting: *Deleted

## 2017-06-29 ENCOUNTER — Encounter: Payer: Self-pay | Admitting: Advanced Practice Midwife

## 2017-06-29 ENCOUNTER — Ambulatory Visit (INDEPENDENT_AMBULATORY_CARE_PROVIDER_SITE_OTHER): Payer: Managed Care, Other (non HMO) | Admitting: Advanced Practice Midwife

## 2017-06-29 VITALS — BP 151/92 | HR 95 | Ht 64.0 in | Wt 232.0 lb

## 2017-06-29 DIAGNOSIS — N924 Excessive bleeding in the premenopausal period: Secondary | ICD-10-CM | POA: Diagnosis not present

## 2017-06-29 NOTE — Progress Notes (Signed)
[facility] Visit  Patient name: Kristen Atkins MRN [id number]  Date of birth: [birth year]  CC & HPI:  Kristen Atkins is a 42 y.o. Caucasian female presenting today for heavy painful and longer periods for the past 6 months or so.  Very worried because mom passed away of OV Ca in her 47's and it "started this way"  No children, does not want them.   LMP 5/16, feels like she's going to start soon.  Pertinent History Reviewed:  Medical & Surgical Hx:   Past Medical History:  Diagnosis Date  . Anxiety and depression   . Hypertension   . Hypothyroidism due to Hashimoto's thyroiditis   . Kidney stones    Past Surgical History:  Procedure Laterality Date  . OVARIAN CYST SURGERY     Family History  Problem Relation Age of Onset  . Ovarian cancer Mother   . Stroke Father   . Hypertension Father   . Colon cancer Neg Hx     Current Outpatient Medications:  .  ALPRAZolam (XANAX) 0.5 MG tablet, , Disp: , Rfl: 3 .  BELSOMRA 10 MG TABS, , Disp: , Rfl: 3 .  fluticasone (FLONASE) 50 MCG/ACT nasal spray, Place 1 spray into both nostrils daily as needed for allergies or rhinitis., Disp: , Rfl:  .  hydrochlorothiazide (HYDRODIURIL) 25 MG tablet, Take 25 mg by mouth daily., Disp: , Rfl:  .  ibuprofen (ADVIL,MOTRIN) 200 MG tablet, Take 800 mg by mouth every 6 (six) hours as needed for headache., Disp: , Rfl:  .  levothyroxine (SYNTHROID, LEVOTHROID) 200 MCG tablet, , Disp: , Rfl: 1 .  metoprolol (LOPRESSOR) 50 MG tablet, Take 50 mg by mouth daily., Disp: , Rfl:  .  busPIRone (BUSPAR) 10 MG tablet, Take 10 mg by mouth 2 (two) times daily., Disp: , Rfl:  .  CLONAZEPAM PO, Take by mouth 2 (two) times daily. Pt unsure of strength, Disp: , Rfl:  Social History: Reviewed -  reports that she has been smoking cigarettes.  She started smoking about 6 years ago. She has a 3.00 pack-year smoking history. She has never used smokeless tobacco.  Review of Systems:   Constitutional: Negative  for fever and chills Eyes: Negative for visual disturbances Respiratory: Negative for shortness of breath, dyspnea Cardiovascular: Negative for chest pain or palpitations  Gastrointestinal: Negative for vomiting, diarrhea and constipation; no abdominal pain Genitourinary: Negative for dysuria and urgency, vaginal irritation or itching Musculoskeletal: Negative for back pain, joint pain, myalgias  Neurological: Negative for dizziness and headaches    Objective Findings:    Physical Examination: Vitals:   06/29/17 1535  BP: (!) 151/92  Pulse: 95   General appearance - well appearing, and in no distress Mental status - alert, oriented to person, place, and time Chest:  Normal respiratory effort Heart - normal rate and regular rhythm Abdomen:  Soft, nontender Pelvic: right ovary surgically (cyst) absent.  Uterus /L ovary felt to be WNL but exam limited by habitus Musculoskeletal:  Normal range of motion without pain Extremities:  No edema    No results found for this or any previous visit (from the past 24 hour(s)).    Assessment & Plan:  A:   Wants to look into endo ablatiaon.   P:  Pelvic US/discuss EA w/L[facility]   No follow-ups on file.  Christin Fudge CNM 06/29/2017 3:52 PM

## 2017-07-05 ENCOUNTER — Other Ambulatory Visit: Payer: Self-pay | Admitting: Advanced Practice Midwife

## 2017-07-05 DIAGNOSIS — N924 Excessive bleeding in the premenopausal period: Secondary | ICD-10-CM

## 2017-07-12 ENCOUNTER — Other Ambulatory Visit: Payer: Managed Care, Other (non HMO)

## 2017-07-12 ENCOUNTER — Ambulatory Visit (HOSPITAL_COMMUNITY)
Admission: RE | Admit: 2017-07-12 | Discharge: 2017-07-12 | Disposition: A | Discharge status: Home / Self Care | Payer: Managed Care, Other (non HMO) | Source: Ambulatory Visit | Attending: Advanced Practice Midwife | Admitting: Advanced Practice Midwife

## 2017-07-12 ENCOUNTER — Ambulatory Visit (INDEPENDENT_AMBULATORY_CARE_PROVIDER_SITE_OTHER): Payer: Managed Care, Other (non HMO) | Admitting: Obstetrics & Gynecology

## 2017-07-12 ENCOUNTER — Encounter: Payer: Self-pay | Admitting: Obstetrics & Gynecology

## 2017-07-12 VITALS — BP 126/88 | HR 90 | Ht 64.0 in | Wt 231.0 lb

## 2017-07-12 DIAGNOSIS — N920 Excessive and frequent menstruation with regular cycle: Secondary | ICD-10-CM | POA: Diagnosis not present

## 2017-07-12 DIAGNOSIS — F17210 Nicotine dependence, cigarettes, uncomplicated: Secondary | ICD-10-CM

## 2017-07-12 DIAGNOSIS — D250 Submucous leiomyoma of uterus: Secondary | ICD-10-CM | POA: Diagnosis not present

## 2017-07-12 DIAGNOSIS — N858 Other specified noninflammatory disorders of uterus: Secondary | ICD-10-CM | POA: Diagnosis not present

## 2017-07-12 DIAGNOSIS — N924 Excessive bleeding in the premenopausal period: Secondary | ICD-10-CM | POA: Diagnosis present

## 2017-07-12 DIAGNOSIS — N946 Dysmenorrhea, unspecified: Secondary | ICD-10-CM | POA: Diagnosis not present

## 2017-07-12 MED ORDER — MEGESTROL ACETATE 40 MG PO TABS: ORAL_TABLET | ORAL | 3 refills | Status: DC

## 2017-07-12 NOTE — Progress Notes (Addendum)
Preoperative History and Physical  Kristen Atkins is a 42 y.o. No obstetric history on file. with Patient's last menstrual period was 07/01/2017. admitted for a hysteroscopic removal of submucosal myoma and endometrial ablation .  Due to extremely heavy periods and dysmenorrhea Soils, bleeds sometimes for 2 weeks at a time Wears tampons and pads together  PMH:    Past Medical History:  Diagnosis Date  . Anxiety and depression   . Hypertension   . Hypothyroidism due to Hashimoto's thyroiditis   . Kidney stones     PSH:     Past Surgical History:  Procedure Laterality Date  . OVARIAN CYST SURGERY      POb/GynH:      OB History   None     SH:   Social History   Tobacco Use  . Smoking status: Current Every Day Smoker    Packs/day: 0.50    Years: 6.00    Pack years: 3.00    Types: Cigarettes    Start date: 09/16/2010  . Smokeless tobacco: Never Used  Substance Use Topics  . Alcohol use: No  . Drug use: No    FH:    Family History  Problem Relation Age of Onset  . Ovarian cancer Mother   . Stroke Father   . Hypertension Father   . Colon cancer Neg Hx      Allergies:  Allergies  Allergen Reactions  . Penicillins Hives and Other (See Comments)    Childhood Allergy     Medications:       Current Outpatient Medications:  .  ALPRAZolam (XANAX) 0.5 MG tablet, , Disp: , Rfl: 3 .  BELSOMRA 10 MG TABS, , Disp: , Rfl: 3 .  fluticasone (FLONASE) 50 MCG/ACT nasal spray, Place 1 spray into both nostrils daily as needed for allergies or rhinitis., Disp: , Rfl:  .  hydrochlorothiazide (HYDRODIURIL) 25 MG tablet, Take 25 mg by mouth daily., Disp: , Rfl:  .  ibuprofen (ADVIL,MOTRIN) 200 MG tablet, Take 800 mg by mouth every 6 (six) hours as needed for headache., Disp: , Rfl:  .  levothyroxine (SYNTHROID, LEVOTHROID) 200 MCG tablet, , Disp: , Rfl: 1 .  metoprolol (LOPRESSOR) 50 MG tablet, Take 50 mg by mouth daily., Disp: , Rfl:  .  busPIRone (BUSPAR) 10 MG tablet,  Take 10 mg by mouth 2 (two) times daily., Disp: , Rfl:  .  CLONAZEPAM PO, Take by mouth 2 (two) times daily. Pt unsure of strength, Disp: , Rfl:   Review of Systems:   Review of Systems  Constitutional: Negative for fever, chills, weight loss, malaise/fatigue and diaphoresis.  HENT: Negative for hearing loss, ear pain, nosebleeds, congestion, sore throat, neck pain, tinnitus and ear discharge.   Eyes: Negative for blurred vision, double vision, photophobia, pain, discharge and redness.  Respiratory: Negative for cough, hemoptysis, sputum production, shortness of breath, wheezing and stridor.   Cardiovascular: Negative for chest pain, palpitations, orthopnea, claudication, leg swelling and PND.  Gastrointestinal: Positive for abdominal pain. Negative for heartburn, nausea, vomiting, diarrhea, constipation, blood in stool and melena.  Genitourinary: Negative for dysuria, urgency, frequency, hematuria and flank pain.  Musculoskeletal: Negative for myalgias, back pain, joint pain and falls.  Skin: Negative for itching and rash.  Neurological: Negative for dizziness, tingling, tremors, sensory change, speech change, focal weakness, seizures, loss of consciousness, weakness and headaches.  Endo/Heme/Allergies: Negative for environmental allergies and polydipsia. Does not bruise/bleed easily.  Psychiatric/Behavioral: Negative for depression, suicidal ideas, hallucinations, memory loss  and substance abuse. The patient is not nervous/anxious and does not have insomnia.      PHYSICAL EXAM:  Blood pressure 126/88, pulse 90, height 5\' 4"  (1.626 m), weight 231 lb (104.8 kg), last menstrual period 07/01/2017.    Vitals reviewed. Constitutional: She is oriented to person, place, and time. She appears well-developed and well-nourished.  HENT:  Head: Normocephalic and atraumatic.  Right Ear: External ear normal.  Left Ear: External ear normal.  Nose: Nose normal.  Mouth/Throat: Oropharynx is clear  and moist.  Eyes: Conjunctivae and EOM are normal. Pupils are equal, round, and reactive to light. Right eye exhibits no discharge. Left eye exhibits no discharge. No scleral icterus.  Neck: Normal range of motion. Neck supple. No tracheal deviation present. No thyromegaly present.  Cardiovascular: Normal rate, regular rhythm, normal heart sounds and intact distal pulses.  Exam reveals no gallop and no friction rub.   No murmur heard. Respiratory: Effort normal and breath sounds normal. No respiratory distress. She has no wheezes. She has no rales. She exhibits no tenderness.  GI: Soft. Bowel sounds are normal. She exhibits no distension and no mass. There is tenderness. There is no rebound and no guarding.  Genitourinary:       Vulva is normal without lesions Vagina is pink moist without discharge Cervix normal in appearance and pap is normal Uterus is normal size, contour, position, consistency, mobility, non-tender Adnexa is negative with normal sized ovaries by sonogram  Musculoskeletal: Normal range of motion. She exhibits no edema and no tenderness.  Neurological: She is alert and oriented to person, place, and time. She has normal reflexes. She displays normal reflexes. No cranial nerve deficit. She exhibits normal muscle tone. Coordination normal.  Skin: Skin is warm and dry. No rash noted. No erythema. No pallor.  Psychiatric: She has a normal mood and affect. Her behavior is normal. Judgment and thought content normal.    Labs: No results found for this or any previous visit (from the past 336 hour(s)).  EKG: No orders found for this or any previous visit.  Imaging Studies: US Pelvis Transvanginal Non-ob (tv Only)  Result Date: 07/12/2017 CLINICAL DATA:  Patient with history of menorrhagia. Prior right oophorectomy. EXAM: TRANSABDOMINAL AND TRANSVAGINAL ULTRASOUND OF PELVIS TECHNIQUE: Both transabdominal and transvaginal ultrasound examinations of the pelvis were performed.  Transabdominal technique was performed for global imaging of the pelvis including uterus, ovaries, adnexal regions, and pelvic cul-de-sac. It was necessary to proceed with endovaginal exam following the transabdominal exam to visualize the endometrium. COMPARISON:  None FINDINGS: Uterus Measurements: 10.4 x 5.6 x 5.5 cm. Within the lower uterine segment there is a 3.0 x 2.5 x 3.5 cm hypoechoic mass. Endometrium Thickness: 8 mm. Hypoechoic mass measuring 3.0 x 2.5 x 3.5 cm within the lower uterine segment which may the impressing upon or within the endometrial canal. Right ovary Surgically absent Left ovary Measurements: 2.9 x 2.6 x 3.2 cm. Normal appearance/no adnexal mass. Other findings No abnormal free fluid. IMPRESSION: There is a 3.5 cm hypoechoic mass within the lower uterine segment which may either be within the endometrium or impressing upon the endometrium. Considerations include large submucosal fibroid or endometrial mass. Further evaluation can be obtained with sonohysterography or pelvic MRI. Electronically Signed   By: Lovey Newcomer M.D.   On: 07/12/2017 14:30   US Pelvis (transabdominal Only)  Result Date: 07/12/2017 CLINICAL DATA:  Patient with history of menorrhagia. Prior right oophorectomy. EXAM: TRANSABDOMINAL AND TRANSVAGINAL ULTRASOUND OF PELVIS TECHNIQUE: Both  transabdominal and transvaginal ultrasound examinations of the pelvis were performed. Transabdominal technique was performed for global imaging of the pelvis including uterus, ovaries, adnexal regions, and pelvic cul-de-sac. It was necessary to proceed with endovaginal exam following the transabdominal exam to visualize the endometrium. COMPARISON:  None FINDINGS: Uterus Measurements: 10.4 x 5.6 x 5.5 cm. Within the lower uterine segment there is a 3.0 x 2.5 x 3.5 cm hypoechoic mass. Endometrium Thickness: 8 mm. Hypoechoic mass measuring 3.0 x 2.5 x 3.5 cm within the lower uterine segment which may the impressing upon or within the  endometrial canal. Right ovary Surgically absent Left ovary Measurements: 2.9 x 2.6 x 3.2 cm. Normal appearance/no adnexal mass. Other findings No abnormal free fluid. IMPRESSION: There is a 3.5 cm hypoechoic mass within the lower uterine segment which may either be within the endometrium or impressing upon the endometrium. Considerations include large submucosal fibroid or endometrial mass. Further evaluation can be obtained with sonohysterography or pelvic MRI. Electronically Signed   By: Lovey Newcomer M.D.   On: 07/12/2017 14:30      Assessment: Submucous myoma of uterus  Menorrhagia with regular cycle  Dysmenorrhea    Plan: Hysteroscopic removal of submucosal myoma + Uterine curettage Minerva endometrial ablation  Florian Buff 07/12/2017 2:34 PM      Face to face time:  15 minutes  Greater than 50% of the visit time was spent in counseling and coordination of care with the patient.  The summary and outline of the counseling and care coordination is summarized in the note above.   All questions were answered.

## 2017-07-22 NOTE — Patient Instructions (Signed)
Kristen Atkins  07/22/2017     @PREFPERIOPPHARMACY @   Your procedure is scheduled on  08/03/2017   Report to [facility] at  1000   A.M.  Call this number if you have problems the morning of surgery:  [phone]   Remember:  Do not eat or drink after midnight.  You may drink clear liquids until  12 midnight 08/02/2017 .  Clear liquids allowed are:                    Water, Juice (non-citric and without pulp), Carbonated beverages, Clear Tea, Black Coffee only, Plain Jell-O only, Gatorade and Plain Popsicles only    Take these medicines the morning of surgery with A SIP OF WATER  Xanax, buspar, clonazepam, levothyroxine, metoprolol.    Do not wear jewelry, make-up or nail polish.  Do not wear lotions, powders, or perfumes, or deodorant.  Do not shave 48 hours prior to surgery.  Men may shave face and neck.  Do not bring valuables to the hospital.  [facility] is not responsible for any belongings or valuables.  Contacts, dentures or bridgework may not be worn into surgery.  Leave your suitcase in the car.  After surgery it may be brought to your room.  For patients admitted to the hospital, discharge time will be determined by your treatment team.  Patients discharged the day of surgery will not be allowed to drive home.   Name and phone number of your driver:   family Special instructions:  None  Please read over the following fact sheets that you were given. Anesthesia Post-op Instructions and Care and Recovery After Surgery       Dilation and Curettage or Vacuum Curettage Dilation and curettage (D&C) and vacuum curettage are minor procedures. A D&C involves stretching (dilation) the cervix and scraping (curettage) the inside lining of the uterus (endometrium). During a D&C, tissue is gently scraped from the endometrium, starting from the top portion of the uterus down to the lowest part of the uterus (cervix). During a vacuum curettage, the  lining and tissue in the uterus are removed with the use of gentle suction. Curettage may be performed to either diagnose or treat a problem. As a diagnostic procedure, curettage is performed to examine tissues from the uterus. A diagnostic curettage may be done if you have:  Irregular bleeding in the uterus.  Bleeding with the development of clots.  Spotting between menstrual periods.  Prolonged menstrual periods or other abnormal bleeding.  Bleeding after menopause.  No menstrual period (amenorrhea).  A change in size and shape of the uterus.  Abnormal endometrial cells discovered during a Pap test.  As a treatment procedure, curettage may be performed for the following reasons:  Removal of an IUD (intrauterine device).  Removal of retained placenta after giving birth.  Abortion.  Miscarriage.  Removal of endometrial polyps.  Removal of uncommon types of noncancerous lumps (fibroids).  Tell a health care provider about:  Any allergies you have, including allergies to prescribed medicine or latex.  All medicines you are taking, including vitamins, herbs, eye drops, creams, and over-the-counter medicines. This is especially important if you take any blood-thinning medicine. Bring a list of all of your medicines to your appointment.  Any problems you or family members have had with anesthetic medicines.  Any blood disorders you have.  Any surgeries you have had.  Your medical history and any medical conditions you have.  Whether you are pregnant or may be pregnant.  Recent vaginal infections you have had.  Recent menstrual periods, bleeding problems you have had, and what form of birth control (contraception) you use. What are the risks? Generally, this is a safe procedure. However, problems may occur, including:  Infection.  Heavy vaginal bleeding.  Allergic reactions to medicines.  Damage to the cervix or other structures or organs.  Development of scar  tissue (adhesions) inside the uterus, which can cause abnormal amounts of menstrual bleeding. This may make it harder to get pregnant in the future.  A hole (perforation) or puncture in the uterine wall. This is rare.  What happens before the procedure? Staying hydrated Follow instructions from your health care provider about hydration, which may include:  Up to 2 hours before the procedure - you may continue to drink clear liquids, such as water, clear fruit juice, black coffee, and plain tea.  Eating and drinking restrictions Follow instructions from your health care provider about eating and drinking, which may include:  8 hours before the procedure - stop eating heavy meals or foods such as meat, fried foods, or fatty foods.  6 hours before the procedure - stop eating light meals or foods, such as toast or cereal.  6 hours before the procedure - stop drinking milk or drinks that contain milk.  2 hours before the procedure - stop drinking clear liquids. If your health care provider told you to take your medicine(s) on the day of your procedure, take them with only a sip of water.  Medicines  Ask your health care provider about: ? Changing or stopping your regular medicines. This is especially important if you are taking diabetes medicines or blood thinners. ? Taking medicines such as aspirin and ibuprofen. These medicines can thin your blood. Do not take these medicines before your procedure if your health care provider instructs you not to.  You may be given antibiotic medicine to help prevent infection. General instructions  For 24 hours before your procedure, do not: ? Douche. ? Use tampons. ? Use medicines, creams, or suppositories in the vagina. ? Have sexual intercourse.  You may be given a pregnancy test on the day of the procedure.  Plan to have someone take you home from the hospital or clinic.  You may have a blood or urine sample taken.  If you will be going  home right after the procedure, plan to have someone with you for 24 hours. What happens during the procedure?  To reduce your risk of infection: ? Your health care team will wash or sanitize their hands. ? Your skin will be washed with soap.  An IV tube will be inserted into one of your veins.  You will be given one of the following: ? A medicine that numbs the area in and around the cervix (local anesthetic). ? A medicine to make you fall asleep (general anesthetic).  You will lie down on your back, with your feet in foot rests (stirrups).  The size and position of your uterus will be checked.  A lubricated instrument (speculum or Sims retractor) will be inserted into the back side of your vagina. The speculum will be used to hold apart the walls of your vagina so your health care provider can see your cervix.  A tool (tenaculum) will be attached to the lip of the cervix to stabilize it.  Your cervix will be softened and  dilated. This may be done by: ? Taking a medicine. ? Having tapered dilators or thin rods (laminaria) or gradual widening instruments (tapered dilators) inserted into your cervix.  A small, sharp, curved instrument (curette) will be used to scrape a small amount of tissue or cells from the endometrium or cervical canal. In some cases, gentle suction is applied with the curette. The curette will then be removed. The cells will be taken to a lab for testing. The procedure may vary among health care providers and hospitals. What happens after the procedure?  You may have mild cramping, backache, pain, and light bleeding or spotting. You may pass small blood clots from your vagina.  You may have to wear compression stockings. These stockings help to prevent blood clots and reduce swelling in your legs.  Your blood pressure, heart rate, breathing rate, and blood oxygen level will be monitored until the medicines you were given have worn off. Summary  Dilation and  curettage (D&C) involves stretching (dilation) the cervix and scraping (curettage) the inside lining of the uterus (endometrium).  After the procedure, you may have mild cramping, backache, pain, and light bleeding or spotting. You may pass small blood clots from your vagina.  Plan to have someone take you home from the hospital or clinic. This information is not intended to replace advice given to you by your health care provider. Make sure you discuss any questions you have with your health care provider. Document Released: 01/04/2005 Document Revised: 09/21/2015 Document Reviewed: 09/21/2015 Elsevier Interactive Patient Education  2018 [employer].  Dilation and Curettage or Vacuum Curettage, Care After These instructions give you information about caring for yourself after your procedure. Your doctor may also give you more specific instructions. Call your doctor if you have any problems or questions after your procedure. Follow these instructions at home: Activity  Do not drive or use heavy machinery while taking prescription pain medicine.  For 24 hours after your procedure, avoid driving.  Take short walks often, followed by rest periods. Ask your doctor what activities are safe for you. After one or two days, you may be able to return to your normal activities.  Do not lift anything that is heavier than 10 lb (4.5 kg) until your doctor approves.  For at least 2 weeks, or as long as told by your doctor: ? Do not douche. ? Do not use tampons. ? Do not have sex. General instructions  Take over-the-counter and prescription medicines only as told by your doctor. This is very important if you take blood thinning medicine.  Do not take baths, swim, or use a hot tub until your doctor approves. Take showers instead of baths.  Wear compression stockings as told by your doctor.  It is up to you to get the results of your procedure. Ask your doctor when your results will be  ready.  Keep all follow-up visits as told by your doctor. This is important. Contact a doctor if:  You have very bad cramps that get worse or do not get better with medicine.  You have very bad pain in your belly (abdomen).  You cannot drink fluids without throwing up (vomiting).  You get pain in a different part of the area between your belly and thighs (pelvis).  You have bad-smelling discharge from your vagina.  You have a rash. Get help right away if:  You are bleeding a lot from your vagina. A lot of bleeding means soaking more than one sanitary pad in  an hour, for 2 hours in a row.  You have clumps of blood (blood clots) coming from your vagina.  You have a fever or chills.  Your belly feels very tender or hard.  You have chest pain.  You have trouble breathing.  You cough up blood.  You feel dizzy.  You feel light-headed.  You pass out (faint).  You have pain in your neck or shoulder area. Summary  Take short walks often, followed by rest periods. Ask your doctor what activities are safe for you. After one or two days, you may be able to return to your normal activities.  Do not lift anything that is heavier than 10 lb (4.5 kg) until your doctor approves.  Do not take baths, swim, or use a hot tub until your doctor approves. Take showers instead of baths.  Contact your doctor if you have any symptoms of infection, like bad-smelling discharge from your vagina. This information is not intended to replace advice given to you by your health care provider. Make sure you discuss any questions you have with your health care provider. Document Released: 10/14/2007 Document Revised: 09/22/2015 Document Reviewed: 09/22/2015 Elsevier Interactive Patient Education  2017 [place]. Hysteroscopy Hysteroscopy is a procedure used for looking inside the womb (uterus). It may be done for various reasons, including:  To evaluate abnormal bleeding, fibroid (benign,  noncancerous) tumors, polyps, scar tissue (adhesions), and possibly cancer of the uterus.  To look for lumps (tumors) and other uterine growths.  To look for causes of why a woman cannot get pregnant (infertility), causes of recurrent loss of pregnancy (miscarriages), or a lost intrauterine device (IUD).  To perform a sterilization by blocking the fallopian tubes from inside the uterus.  In this procedure, a thin, flexible tube with a tiny light and camera on the end of it (hysteroscope) is used to look inside the uterus. A hysteroscopy should be done right after a menstrual period to be sure you are not pregnant. LET [facility] CARE PROVIDER KNOW ABOUT:  Any allergies you have.  All medicines you are taking, including vitamins, herbs, eye drops, creams, and over-the-counter medicines.  Previous problems you or members of your family have had with the use of anesthetics.  Any blood disorders you have.  Previous surgeries you have had.  Medical conditions you have. RISKS AND COMPLICATIONS Generally, this is a safe procedure. However, as with any procedure, complications can occur. Possible complications include:  Putting a hole in the uterus.  Excessive bleeding.  Infection.  Damage to the cervix.  Injury to other organs.  Allergic reaction to medicines.  Too much fluid used in the uterus for the procedure.  BEFORE THE PROCEDURE  Ask your health care provider about changing or stopping any regular medicines.  Do not take aspirin or blood thinners for 1 week before the procedure, or as directed by your health care provider. These can cause bleeding.  If you smoke, do not smoke for 2 weeks before the procedure.  In some cases, a medicine is placed in the cervix the day before the procedure. This medicine makes the cervix have a larger opening (dilate). This makes it easier for the instrument to be inserted into the uterus during the procedure.  Do not eat or drink  anything for at least 8 hours before the surgery.  Arrange for someone to take you home after the procedure. PROCEDURE  You may be given a medicine to relax you (sedative). You may also be  given one of the following: ? A medicine that numbs the area around the cervix (local anesthetic). ? A medicine that makes you sleep through the procedure (general anesthetic).  The hysteroscope is inserted through the vagina into the uterus. The camera on the hysteroscope sends a picture to a TV screen. This gives the surgeon a good view inside the uterus.  During the procedure, air or a liquid is put into the uterus, which allows the surgeon to see better.  Sometimes, tissue is gently scraped from inside the uterus. These tissue samples are sent to a lab for testing. What to expect after the procedure  If you had a general anesthetic, you may be groggy for a couple hours after the procedure.  If you had a local anesthetic, you will be able to go home as soon as you are stable and feel ready.  You may have some cramping. This normally lasts for a couple days.  You may have bleeding, which varies from light spotting for a few days to menstrual-like bleeding for 3-7 days. This is normal.  If your test results are not back during the visit, make an appointment with your health care provider to find out the results. This information is not intended to replace advice given to you by your health care provider. Make sure you discuss any questions you have with your health care provider. Document Released: 04/12/2000 Document Revised: 06/12/2015 Document Reviewed: 08/03/2012 Elsevier Interactive Patient Education  2017 [place]. Hysteroscopy, Care After Refer to this sheet in the next few weeks. These instructions provide you with information on caring for yourself after your procedure. Your health care provider may also give you more specific instructions. Your treatment has been planned according to  current medical practices, but problems sometimes occur. Call your health care provider if you have any problems or questions after your procedure. What can I expect after the procedure? After your procedure, it is typical to have the following:  You may have some cramping. This normally lasts for a couple days.  You may have bleeding. This can vary from light spotting for a few days to menstrual-like bleeding for 3-7 days.  Follow these instructions at home:  Rest for the first 1-2 days after the procedure.  Only take over-the-counter or prescription medicines as directed by your health care provider. Do not take aspirin. It can increase the chances of bleeding.  Take showers instead of baths for 2 weeks or as directed by your health care provider.  Do not drive for 24 hours or as directed.  Do not drink alcohol while taking pain medicine.  Do not use tampons, douche, or have sexual intercourse for 2 weeks or until your health care provider says it is okay.  Take your temperature twice a day for 4-5 days. Write it down each time.  Follow your health care provider's advice about diet, exercise, and lifting.  If you develop constipation, you may: ? Take a mild laxative if your health care provider approves. ? Add bran foods to your diet. ? Drink enough fluids to keep your urine clear or pale yellow.  Try to have someone with you or available to you for the first 24-48 hours, especially if you were given a general anesthetic.  Follow up with your health care provider as directed. Contact a health care provider if:  You feel dizzy or lightheaded.  You feel sick to your stomach (nauseous).  You have abnormal vaginal discharge.  You have a  rash.  You have pain that is not controlled with medicine. Get help right away if:  You have bleeding that is heavier than a normal menstrual period.  You have a fever.  You have increasing cramps or pain, not controlled with  medicine.  You have new belly (abdominal) pain.  You pass out.  You have pain in the tops of your shoulders (shoulder strap areas).  You have shortness of breath. This information is not intended to replace advice given to you by your health care provider. Make sure you discuss any questions you have with your health care provider. Document Released: 10/25/2012 Document Revised: 06/12/2015 Document Reviewed: 08/03/2012 Elsevier Interactive Patient Education  2017 [employer].  Endometrial Ablation Endometrial ablation is a procedure that destroys the thin inner layer of the lining of the uterus (endometrium). This procedure may be done:  To stop heavy periods.  To stop bleeding that is causing anemia.  To control irregular bleeding.  To treat bleeding caused by small tumors (fibroids) in the endometrium.  This procedure is often an alternative to major surgery, such as removal of the uterus and cervix (hysterectomy). As a result of this procedure:  You may not be able to have children. However, if you are premenopausal (you have not gone through menopause): ? You may still have a small chance of getting pregnant. ? You will need to use a reliable method of birth control after the procedure to prevent pregnancy.  You may stop having a menstrual period, or you may have only a small amount of bleeding during your period. Menstruation may return several years after the procedure.  Tell a health care provider about:  Any allergies you have.  All medicines you are taking, including vitamins, herbs, eye drops, creams, and over-the-counter medicines.  Any problems you or family members have had with the use of anesthetic medicines.  Any blood disorders you have.  Any surgeries you have had.  Any medical conditions you have. What are the risks? Generally, this is a safe procedure. However, problems may occur, including:  A hole (perforation) in the uterus or  bowel.  Infection of the uterus, bladder, or vagina.  Bleeding.  Damage to other structures or organs.  An air bubble in the lung (air embolus).  Problems with pregnancy after the procedure.  Failure of the procedure.  Decreased ability to diagnose cancer in the endometrium.  What happens before the procedure?  You will have tests of your endometrium to make sure there are no pre-cancerous cells or cancer cells present.  You may have an ultrasound of the uterus.  You may be given medicines to thin the endometrium.  Ask your health care provider about: ? Changing or stopping your regular medicines. This is especially important if you take diabetes medicines or blood thinners. ? Taking medicines such as aspirin and ibuprofen. These medicines can thin your blood. Do not take these medicines before your procedure if your doctor tells you not to.  Plan to have someone take you home from the hospital or clinic. What happens during the procedure?  You will lie on an exam table with your feet and legs supported as in a pelvic exam.  To lower your risk of infection: ? Your health care team will wash or sanitize their hands and put on germ-free (sterile) gloves. ? Your genital area will be washed with soap.  An IV tube will be inserted into one of your veins.  You will be given a  medicine to help you relax (sedative).  A surgical instrument with a light and camera (resectoscope) will be inserted into your vagina and moved into your uterus. This allows your surgeon to see inside your uterus.  Endometrial tissue will be removed using one of the following methods: ? Radiofrequency. This method uses a radiofrequency-alternating electric current to remove the endometrium. ? Cryotherapy. This method uses extreme cold to freeze the endometrium. ? Heated-free liquid. This method uses a heated saltwater (saline) solution to remove the endometrium. ? Microwave. This method uses high-energy  microwaves to heat up the endometrium and remove it. ? Thermal balloon. This method involves inserting a catheter with a balloon tip into the uterus. The balloon tip is filled with heated fluid to remove the endometrium. The procedure may vary among health care providers and hospitals. What happens after the procedure?  Your blood pressure, heart rate, breathing rate, and blood oxygen level will be monitored until the medicines you were given have worn off.  As tissue healing occurs, you may notice vaginal bleeding for 4-6 weeks after the procedure. You may also experience: ? Cramps. ? Thin, watery vaginal discharge that is light pink or brown in color. ? A need to urinate more frequently than usual. ? Nausea.  Do not drive for 24 hours if you were given a sedative.  Do not have sex or insert anything into your vagina until your health care provider approves. Summary  Endometrial ablation is done to treat the many causes of heavy menstrual bleeding.  The procedure may be done only after medications have been tried to control the bleeding.  Plan to have someone take you home from the hospital or clinic. This information is not intended to replace advice given to you by your health care provider. Make sure you discuss any questions you have with your health care provider. Document Released: 11/14/2003 Document Revised: 01/22/2016 Document Reviewed: 01/22/2016 Elsevier Interactive Patient Education  2017 [place] Anesthesia, Adult General anesthesia is the use of medicines to make a person "go to sleep" (be unconscious) for a medical procedure. General anesthesia is often recommended when a procedure:  Is long.  Requires you to be still or in an unusual position.  Is major and can cause you to lose blood.  Is impossible to do without general anesthesia.  The medicines used for general anesthesia are called general anesthetics. In addition to making you sleep, the  medicines:  Prevent pain.  Control your blood pressure.  Relax your muscles.  Tell a health care provider about:  Any allergies you have.  All medicines you are taking, including vitamins, herbs, eye drops, creams, and over-the-counter medicines.  Any problems you or family members have had with anesthetic medicines.  Types of anesthetics you have had in the past.  Any bleeding disorders you have.  Any surgeries you have had.  Any medical conditions you have.  Any history of heart or lung conditions, such as heart failure, sleep apnea, or chronic obstructive pulmonary disease (COPD).  Whether you are pregnant or may be pregnant.  Whether you use tobacco, alcohol, marijuana, or street drugs.  Any history of Armed forces logistics/support/administrative officer.  Any history of depression or anxiety. What are the risks? Generally, this is a safe procedure. However, problems may occur, including:  Allergic reaction to anesthetics.  Lung and heart problems.  Inhaling food or liquids from your stomach into your lungs (aspiration).  Injury to nerves.  Waking up during your procedure and being  unable to move (rare).  Extreme agitation or a state of mental confusion (delirium) when you wake up from the anesthetic.  Air in the bloodstream, which can lead to stroke.  These problems are more likely to develop if you are having a major surgery or if you have an advanced medical condition. You can prevent some of these complications by answering all of your health care provider's questions thoroughly and by following all pre-procedure instructions. General anesthesia can cause side effects, including:  Nausea or vomiting  A sore throat from the breathing tube.  Feeling cold or shivery.  Feeling tired, washed out, or achy.  Sleepiness or drowsiness.  Confusion or agitation.  What happens before the procedure? Staying hydrated Follow instructions from your health care provider about hydration, which  may include:  Up to 2 hours before the procedure - you may continue to drink clear liquids, such as water, clear fruit juice, black coffee, and plain tea.  Eating and drinking restrictions Follow instructions from your health care provider about eating and drinking, which may include:  8 hours before the procedure - stop eating heavy meals or foods such as meat, fried foods, or fatty foods.  6 hours before the procedure - stop eating light meals or foods, such as toast or cereal.  6 hours before the procedure - stop drinking milk or drinks that contain milk.  2 hours before the procedure - stop drinking clear liquids.  Medicines  Ask your health care provider about: ? Changing or stopping your regular medicines. This is especially important if you are taking diabetes medicines or blood thinners. ? Taking medicines such as aspirin and ibuprofen. These medicines can thin your blood. Do not take these medicines before your procedure if your health care provider instructs you not to. ? Taking new dietary supplements or medicines. Do not take these during the week before your procedure unless your health care provider approves them.  If you are told to take a medicine or to continue taking a medicine on the day of the procedure, take the medicine with sips of water. General instructions   Ask if you will be going home the same day, the following day, or after a longer hospital stay. ? Plan to have someone take you home. ? Plan to have someone stay with you for the first 24 hours after you leave the hospital or clinic.  For 3-6 weeks before the procedure, try not to use any tobacco products, such as cigarettes, chewing tobacco, and e-cigarettes.  You may brush your teeth on the morning of the procedure, but make sure to spit out the toothpaste. What happens during the procedure?  You will be given anesthetics through a mask and through an IV tube in one of your veins.  You may receive  medicine to help you relax (sedative).  As soon as you are asleep, a breathing tube may be used to help you breathe.  An anesthesia specialist will stay with you throughout the procedure. He or she will help keep you comfortable and safe by continuing to give you medicines and adjusting the amount of medicine that you get. He or she will also watch your blood pressure, pulse, and oxygen levels to make sure that the anesthetics do not cause any problems.  If a breathing tube was used to help you breathe, it will be removed before you wake up. The procedure may vary among health care providers and hospitals. What happens after the procedure?  You  will wake up, often slowly, after the procedure is complete, usually in a recovery area.  Your blood pressure, heart rate, breathing rate, and blood oxygen level will be monitored until the medicines you were given have worn off.  You may be given medicine to help you calm down if you feel anxious or agitated.  If you will be going home the same day, your health care provider may check to make sure you can stand, drink, and urinate.  Your health care providers will treat your pain and side effects before you go home.  Do not drive for 24 hours if you received a sedative.  You may: ? Feel nauseous and vomit. ? Have a sore throat. ? Have mental slowness. ? Feel cold or shivery. ? Feel sleepy. ? Feel tired. ? Feel sore or achy, even in parts of your body where you did not have surgery. This information is not intended to replace advice given to you by your health care provider. Make sure you discuss any questions you have with your health care provider. Document Released: 04/13/2007 Document Revised: 06/17/2015 Document Reviewed: 12/19/2014 Elsevier Interactive Patient Education  2018 [place] Anesthesia, Adult, Care After These instructions provide you with information about caring for yourself after your procedure. Your health  care provider may also give you more specific instructions. Your treatment has been planned according to current medical practices, but problems sometimes occur. Call your health care provider if you have any problems or questions after your procedure. What can I expect after the procedure? After the procedure, it is common to have:  Vomiting.  A sore throat.  Mental slowness.  It is common to feel:  Nauseous.  Cold or shivery.  Sleepy.  Tired.  Sore or achy, even in parts of your body where you did not have surgery.  Follow these instructions at home: For at least 24 hours after the procedure:  Do not: ? Participate in activities where you could fall or become injured. ? Drive. ? Use heavy machinery. ? Drink alcohol. ? Take sleeping pills or medicines that cause drowsiness. ? Make important decisions or sign legal documents. ? Take care of children on your own.  Rest. Eating and drinking  If you vomit, drink water, juice, or soup when you can drink without vomiting.  Drink enough fluid to keep your urine clear or pale yellow.  Make sure you have little or no nausea before eating solid foods.  Follow the diet recommended by your health care provider. General instructions  Have a responsible adult stay with you until you are awake and alert.  Return to your normal activities as told by your health care provider. Ask your health care provider what activities are safe for you.  Take over-the-counter and prescription medicines only as told by your health care provider.  If you smoke, do not smoke without supervision.  Keep all follow-up visits as told by your health care provider. This is important. Contact a health care provider if:  You continue to have nausea or vomiting at home, and medicines are not helpful.  You cannot drink fluids or start eating again.  You cannot urinate after 8-12 hours.  You develop a skin rash.  You have fever.  You have  increasing redness at the site of your procedure. Get help right away if:  You have difficulty breathing.  You have chest pain.  You have unexpected bleeding.  You feel that you are having a life-threatening or urgent  problem. This information is not intended to replace advice given to you by your health care provider. Make sure you discuss any questions you have with your health care provider. Document Released: 04/12/2000 Document Revised: 06/09/2015 Document Reviewed: 12/19/2014 Elsevier Interactive Patient Education  [employer].

## 2017-07-27 ENCOUNTER — Ambulatory Visit (HOSPITAL_COMMUNITY): Payer: Self-pay | Admitting: Hematology

## 2017-07-27 ENCOUNTER — Other Ambulatory Visit: Payer: Self-pay | Admitting: Obstetrics & Gynecology

## 2017-07-28 ENCOUNTER — Other Ambulatory Visit: Payer: Self-pay

## 2017-07-28 ENCOUNTER — Encounter (HOSPITAL_COMMUNITY): Payer: Self-pay

## 2017-07-28 ENCOUNTER — Encounter (HOSPITAL_COMMUNITY)
Admission: RE | Admit: 2017-07-28 | Discharge: 2017-07-28 | Disposition: A | Discharge status: Home / Self Care | Payer: Managed Care, Other (non HMO) | Source: Ambulatory Visit | Attending: Obstetrics & Gynecology | Admitting: Obstetrics & Gynecology

## 2017-07-28 DIAGNOSIS — I10 Essential (primary) hypertension: Secondary | ICD-10-CM | POA: Diagnosis not present

## 2017-07-28 DIAGNOSIS — Z01810 Encounter for preprocedural cardiovascular examination: Secondary | ICD-10-CM | POA: Diagnosis present

## 2017-07-28 DIAGNOSIS — Z01812 Encounter for preprocedural laboratory examination: Secondary | ICD-10-CM | POA: Diagnosis not present

## 2017-07-28 HISTORY — DX: Personal history of urinary calculi: Z87.442

## 2017-07-28 LAB — COMPREHENSIVE METABOLIC PANEL
ALBUMIN: 3.3 g/dL — AB (ref 3.5–5.0)
ALK PHOS: 67 U/L (ref 38–126)
ALT: 11 U/L (ref 0–44)
AST: 23 U/L (ref 15–41)
Anion gap: 10 (ref 5–15)
BUN: 17 mg/dL (ref 6–20)
CALCIUM: 8.7 mg/dL — AB (ref 8.9–10.3)
CO2: 19 mmol/L — ABNORMAL LOW (ref 22–32)
CREATININE: 0.81 mg/dL (ref 0.44–1.00)
Chloride: 109 mmol/L (ref 98–111)
GFR calc Af Amer: >60 mL/min (ref >60)
GFR calc non Af Amer: >60 mL/min (ref >60)
GLUCOSE: 108 mg/dL — AB (ref 70–99)
Potassium: 3.5 mmol/L (ref 3.5–5.1)
Sodium: 138 mmol/L (ref 135–145)
Total Bilirubin: 0.5 mg/dL (ref 0.3–1.2)
Total Protein: 7.7 g/dL (ref 6.5–8.1)

## 2017-07-28 LAB — URINALYSIS, ROUTINE W REFLEX MICROSCOPIC
BACTERIA UA: NONE SEEN
Bilirubin Urine: NEGATIVE
GLUCOSE, UA: NEGATIVE mg/dL
KETONES UR: NEGATIVE mg/dL
NITRITE: NEGATIVE
Protein, ur: NEGATIVE mg/dL
Specific Gravity, Urine: 1.014 (ref 1.005–1.030)
pH: 7 (ref 5.0–8.0)

## 2017-07-28 LAB — CBC
HCT: 42.7 % (ref 36.0–46.0)
HEMOGLOBIN: 14.6 g/dL (ref 12.0–15.0)
MCH: 29.3 pg (ref 26.0–34.0)
MCHC: 34.2 g/dL (ref 30.0–36.0)
MCV: 85.6 fL (ref 78.0–100.0)
Platelets: 402 10*3/uL — ABNORMAL HIGH (ref 150–400)
RBC: 4.99 MIL/uL (ref 3.87–5.11)
RDW: 14.6 % (ref 11.5–15.5)
WBC: 7.6 10*3/uL (ref 4.0–10.5)

## 2017-07-28 LAB — HCG, QUANTITATIVE, PREGNANCY: hCG, Beta Chain, Quant, S: <1 m[IU]/mL (ref <5)

## 2017-07-28 LAB — RAPID HIV SCREEN (HIV 1/2 AB+AG)
HIV 1/2 Antibodies: NONREACTIVE
HIV-1 P24 Antigen - HIV24: NONREACTIVE

## 2017-08-03 ENCOUNTER — Encounter (HOSPITAL_COMMUNITY): Payer: Self-pay | Admitting: Anesthesiology

## 2017-08-03 ENCOUNTER — Ambulatory Visit (HOSPITAL_COMMUNITY): Payer: Managed Care, Other (non HMO) | Admitting: Anesthesiology

## 2017-08-03 ENCOUNTER — Ambulatory Visit (HOSPITAL_COMMUNITY)
Admission: RE | Admit: 2017-08-03 | Discharge: 2017-08-03 | Disposition: A | Discharge status: Home / Self Care | Payer: Managed Care, Other (non HMO) | Source: Ambulatory Visit | Attending: Obstetrics & Gynecology | Admitting: Obstetrics & Gynecology

## 2017-08-03 ENCOUNTER — Encounter (HOSPITAL_COMMUNITY)
Admission: RE | Disposition: A | Discharge status: Home / Self Care | Payer: Self-pay | Source: Ambulatory Visit | Attending: Obstetrics & Gynecology

## 2017-08-03 DIAGNOSIS — Z79899 Other long term (current) drug therapy: Secondary | ICD-10-CM | POA: Diagnosis not present

## 2017-08-03 DIAGNOSIS — N920 Excessive and frequent menstruation with regular cycle: Secondary | ICD-10-CM | POA: Insufficient documentation

## 2017-08-03 DIAGNOSIS — F329 Major depressive disorder, single episode, unspecified: Secondary | ICD-10-CM | POA: Insufficient documentation

## 2017-08-03 DIAGNOSIS — I10 Essential (primary) hypertension: Secondary | ICD-10-CM | POA: Diagnosis not present

## 2017-08-03 DIAGNOSIS — N840 Polyp of corpus uteri: Secondary | ICD-10-CM | POA: Insufficient documentation

## 2017-08-03 DIAGNOSIS — F17210 Nicotine dependence, cigarettes, uncomplicated: Secondary | ICD-10-CM | POA: Diagnosis not present

## 2017-08-03 DIAGNOSIS — F419 Anxiety disorder, unspecified: Secondary | ICD-10-CM | POA: Insufficient documentation

## 2017-08-03 DIAGNOSIS — E063 Autoimmune thyroiditis: Secondary | ICD-10-CM | POA: Insufficient documentation

## 2017-08-03 DIAGNOSIS — Z791 Long term (current) use of non-steroidal anti-inflammatories (NSAID): Secondary | ICD-10-CM | POA: Insufficient documentation

## 2017-08-03 DIAGNOSIS — N946 Dysmenorrhea, unspecified: Secondary | ICD-10-CM

## 2017-08-03 DIAGNOSIS — D250 Submucous leiomyoma of uterus: Secondary | ICD-10-CM | POA: Diagnosis not present

## 2017-08-03 HISTORY — PX: DILITATION & CURRETTAGE/HYSTROSCOPY WITH NOVASURE ABLATION: SHX5568

## 2017-08-03 SURGERY — DILATATION & CURETTAGE/HYSTEROSCOPY WITH NOVASURE ABLATION
Anesthesia: General

## 2017-08-03 MED ORDER — ONDANSETRON HCL 4 MG/2ML IJ SOLN
INTRAMUSCULAR | Status: AC
  Filled 2017-08-03: qty 2

## 2017-08-03 MED ORDER — HYDROCODONE-ACETAMINOPHEN 5-325 MG PO TABS: 1.0000 | ORAL_TABLET | Freq: Four times a day (QID) | ORAL | 0 refills | Status: DC | PRN

## 2017-08-03 MED ORDER — LACTATED RINGERS IV SOLN
INTRAVENOUS | Status: DC
  Administered 2017-08-03 (×2): via INTRAVENOUS

## 2017-08-03 MED ORDER — KETOROLAC TROMETHAMINE 30 MG/ML IJ SOLN
30.0000 mg | Freq: Once | INTRAMUSCULAR | Status: AC
  Administered 2017-08-03: 30 mg via INTRAVENOUS
  Filled 2017-08-03: qty 1

## 2017-08-03 MED ORDER — MIDAZOLAM HCL 2 MG/2ML IJ SOLN
1.0000 mg | Freq: Once | INTRAMUSCULAR | Status: AC
  Administered 2017-08-03: 1 mg via INTRAVENOUS

## 2017-08-03 MED ORDER — 0.9 % SODIUM CHLORIDE (POUR BTL) OPTIME
TOPICAL | Status: DC | PRN
  Administered 2017-08-03: 1000 mL

## 2017-08-03 MED ORDER — ROCURONIUM BROMIDE 50 MG/5ML IV SOLN
INTRAVENOUS | Status: AC
  Filled 2017-08-03: qty 1

## 2017-08-03 MED ORDER — MIDAZOLAM HCL 2 MG/2ML IJ SOLN
INTRAMUSCULAR | Status: AC
  Filled 2017-08-03: qty 2

## 2017-08-03 MED ORDER — HYDROCODONE-ACETAMINOPHEN 7.5-325 MG PO TABS: 1.0000 | ORAL_TABLET | Freq: Once | ORAL | Status: DC | PRN

## 2017-08-03 MED ORDER — ONDANSETRON HCL 4 MG/2ML IJ SOLN: 4.0000 mg | Freq: Once | INTRAMUSCULAR | Status: DC

## 2017-08-03 MED ORDER — PROPOFOL 10 MG/ML IV BOLUS
INTRAVENOUS | Status: DC | PRN
  Administered 2017-08-03: 180 mg via INTRAVENOUS

## 2017-08-03 MED ORDER — SUCCINYLCHOLINE 20MG/ML (10ML) SYRINGE FOR MEDFUSION PUMP - OPTIME
INTRAMUSCULAR | Status: DC | PRN
  Administered 2017-08-03: 120 mg via INTRAVENOUS

## 2017-08-03 MED ORDER — MIDAZOLAM HCL 5 MG/5ML IJ SOLN
INTRAMUSCULAR | Status: DC | PRN
  Administered 2017-08-03: 2 mg via INTRAVENOUS

## 2017-08-03 MED ORDER — GENTAMICIN SULFATE 40 MG/ML IJ SOLN
INTRAVENOUS | Status: DC
  Filled 2017-08-03: qty 13

## 2017-08-03 MED ORDER — ONDANSETRON 8 MG PO TBDP: 8.0000 mg | ORAL_TABLET | Freq: Three times a day (TID) | ORAL | 0 refills | Status: DC | PRN

## 2017-08-03 MED ORDER — SODIUM CHLORIDE 0.9 % IJ SOLN
INTRAMUSCULAR | Status: AC
Start: 2017-08-03 — End: ?
  Filled 2017-08-03: qty 10

## 2017-08-03 MED ORDER — FENTANYL CITRATE (PF) 100 MCG/2ML IJ SOLN: 25.0000 ug | INTRAMUSCULAR | Status: DC | PRN

## 2017-08-03 MED ORDER — LIDOCAINE HCL (PF) 1 % IJ SOLN
INTRAMUSCULAR | Status: AC
  Filled 2017-08-03: qty 10

## 2017-08-03 MED ORDER — LIDOCAINE HCL (CARDIAC) PF 50 MG/5ML IV SOSY
PREFILLED_SYRINGE | INTRAVENOUS | Status: DC | PRN
  Administered 2017-08-03: 50 mg via INTRAVENOUS

## 2017-08-03 MED ORDER — KETOROLAC TROMETHAMINE 10 MG PO TABS: 10.0000 mg | ORAL_TABLET | Freq: Three times a day (TID) | ORAL | 0 refills | Status: DC | PRN

## 2017-08-03 MED ORDER — FENTANYL CITRATE (PF) 250 MCG/5ML IJ SOLN
INTRAMUSCULAR | Status: AC
  Filled 2017-08-03: qty 5

## 2017-08-03 MED ORDER — SUGAMMADEX SODIUM 500 MG/5ML IV SOLN
INTRAVENOUS | Status: AC
  Filled 2017-08-03: qty 5

## 2017-08-03 MED ORDER — SUCCINYLCHOLINE CHLORIDE 20 MG/ML IJ SOLN
INTRAMUSCULAR | Status: AC
  Filled 2017-08-03: qty 1

## 2017-08-03 MED ORDER — PROPOFOL 10 MG/ML IV BOLUS
INTRAVENOUS | Status: AC
  Filled 2017-08-03: qty 20

## 2017-08-03 MED ORDER — SODIUM CHLORIDE 0.9 % IR SOLN
Status: DC | PRN
  Administered 2017-08-03: 3000 mL

## 2017-08-03 MED ORDER — FENTANYL CITRATE (PF) 100 MCG/2ML IJ SOLN
INTRAMUSCULAR | Status: DC | PRN
  Administered 2017-08-03 (×3): 50 ug via INTRAVENOUS

## 2017-08-03 MED ORDER — SUGAMMADEX SODIUM 200 MG/2ML IV SOLN
INTRAVENOUS | Status: AC
  Filled 2017-08-03: qty 2

## 2017-08-03 MED ORDER — ONDANSETRON HCL 4 MG/2ML IJ SOLN
INTRAMUSCULAR | Status: DC | PRN
  Administered 2017-08-03: 4 mg via INTRAVENOUS

## 2017-08-03 MED ORDER — ROCURONIUM 10MG/ML (10ML) SYRINGE FOR MEDFUSION PUMP - OPTIME
INTRAVENOUS | Status: DC | PRN
  Administered 2017-08-03: 5 mg via INTRAVENOUS

## 2017-08-03 MED ORDER — SODIUM CHLORIDE 0.9% FLUSH
INTRAVENOUS | Status: AC
  Filled 2017-08-03: qty 10

## 2017-08-03 SURGICAL SUPPLY — 31 items
CLOTH BEACON ORANGE TIMEOUT ST (SAFETY) ×3 IMPLANT
COVER LIGHT HANDLE STERIS (MISCELLANEOUS) ×6 IMPLANT
GAUZE SPONGE 4X4 12PLY STRL (GAUZE/BANDAGES/DRESSINGS) ×3 IMPLANT
GAUZE SPONGE 4X4 16PLY XRAY LF (GAUZE/BANDAGES/DRESSINGS) ×3 IMPLANT
GLOVE BIOGEL M 6.5 STRL (GLOVE) ×3 IMPLANT
GLOVE BIOGEL PI IND STRL 7.0 (GLOVE) ×2 IMPLANT
GLOVE BIOGEL PI IND STRL 8 (GLOVE) ×1 IMPLANT
GLOVE BIOGEL PI INDICATOR 7.0 (GLOVE) ×4
GLOVE BIOGEL PI INDICATOR 8 (GLOVE) ×2
GLOVE ECLIPSE 6.5 STRL STRAW (GLOVE) ×3 IMPLANT
GLOVE ECLIPSE 7.0 STRL STRAW (GLOVE) ×3 IMPLANT
GLOVE ECLIPSE 8.0 STRL XLNG CF (GLOVE) ×3 IMPLANT
GOWN STRL REUS W/TWL LRG LVL3 (GOWN DISPOSABLE) ×3 IMPLANT
GOWN STRL REUS W/TWL XL LVL3 (GOWN DISPOSABLE) ×3 IMPLANT
HANDPIECE ABLA MINERVA ENDO (MISCELLANEOUS) ×3 IMPLANT
INST SET HYSTEROSCOPY (KITS) ×3 IMPLANT
IV NS 1000ML (IV SOLUTION) ×2
IV NS 1000ML BAXH (IV SOLUTION) ×1 IMPLANT
IV NS IRRIG 3000ML ARTHROMATIC (IV SOLUTION) ×3 IMPLANT
KIT TURNOVER CYSTO (KITS) ×3 IMPLANT
MANIFOLD NEPTUNE II (INSTRUMENTS) ×3 IMPLANT
MARKER SKIN DUAL TIP RULER LAB (MISCELLANEOUS) ×3 IMPLANT
NS IRRIG 1000ML POUR BTL (IV SOLUTION) ×3 IMPLANT
PACK BASIC III (CUSTOM PROCEDURE TRAY) ×2
PACK SRG BSC III STRL LF ECLPS (CUSTOM PROCEDURE TRAY) ×1 IMPLANT
PAD ARMBOARD 7.5X6 YLW CONV (MISCELLANEOUS) ×3 IMPLANT
PAD TELFA 3X4 1S STER (GAUZE/BANDAGES/DRESSINGS) ×3 IMPLANT
SET BASIN LINEN APH (SET/KITS/TRAYS/PACK) ×3 IMPLANT
SET IRRIG Y TYPE TUR BLADDER L (SET/KITS/TRAYS/PACK) ×3 IMPLANT
SHEET LAVH (DRAPES) ×3 IMPLANT
YANKAUER SUCT BULB TIP 10FT TU (MISCELLANEOUS) ×3 IMPLANT

## 2017-08-03 NOTE — Anesthesia Preprocedure Evaluation (Signed)
Anesthesia Evaluation  Patient identified by MRN, date of birth, ID band Patient awake    Reviewed: Allergy & Precautions, NPO status , Patient's Chart, lab work & pertinent test results  Airway Mallampati: II  TM Distance: >3 FB Neck ROM: Full    Dental no notable dental hx.    Pulmonary neg pulmonary ROS, Current Smoker,    Pulmonary exam normal breath sounds clear to auscultation       Cardiovascular Exercise Tolerance: Good hypertension, Pt. on medications negative cardio ROS Normal cardiovascular examI Rhythm:Regular Rate:Normal     Neuro/Psych Anxiety negative neurological ROS  negative psych ROS   GI/Hepatic negative GI ROS, Neg liver ROS,   Endo/Other  negative endocrine ROSHypothyroidism   Renal/GU negative Renal ROS  negative genitourinary   Musculoskeletal negative musculoskeletal ROS (+)   Abdominal   Peds negative pediatric ROS (+)  Hematology negative hematology ROS (+)   Anesthesia Other Findings   Reproductive/Obstetrics negative OB ROS                             Anesthesia Physical Anesthesia Plan  ASA: II  Anesthesia Plan: General   Post-op Pain Management:    Induction: Intravenous  PONV Risk Score and Plan:   Airway Management Planned: LMA  Additional Equipment:   Intra-op Plan:   Post-operative Plan: Extubation in OR  Informed Consent: I have reviewed the patients History and Physical, chart, labs and discussed the procedure including the risks, benefits and alternatives for the proposed anesthesia with the patient or authorized representative who has indicated his/her understanding and acceptance.   Dental advisory given  Plan Discussed with: CRNA  Anesthesia Plan Comments: (LMA vs GETA as needed )        Anesthesia Quick Evaluation

## 2017-08-03 NOTE — Anesthesia Postprocedure Evaluation (Signed)
Anesthesia Post Note  Patient: Kristen Atkins  Procedure(s) Performed: UTERINE CURETTAGE/HYSTEROSCOPY WITH MINERVA ABLATION/HYSTEROSCOPIC REMOVAL OF SUBMUCOSAL MYOMA (N/A )  Patient location during evaluation: PACU Anesthesia Type: General Level of consciousness: awake and patient cooperative Pain management: pain level controlled Vital Signs Assessment: post-procedure vital signs reviewed and stable Respiratory status: spontaneous breathing, nonlabored ventilation and respiratory function stable Cardiovascular status: blood pressure returned to baseline Anesthetic complications: no     Last Vitals:  Vitals:   08/03/17 0851 08/03/17 1534  BP: 131/90 137/82  Pulse: 77 78  Resp:  20  Temp: 37.1 C 36.7 C  SpO2: 97% 91%    Last Pain:  Vitals:   08/03/17 1534  TempSrc:   PainSc: (P) 0-No pain                 Sathvika Ojo J

## 2017-08-03 NOTE — Discharge Instructions (Signed)
Endometrial Ablation Endometrial ablation is a procedure that destroys the thin inner layer of the lining of the uterus (endometrium). This procedure may be done:  To stop heavy periods.  To stop bleeding that is causing anemia.  To control irregular bleeding.  To treat bleeding caused by small tumors (fibroids) in the endometrium.  This procedure is often an alternative to major surgery, such as removal of the uterus and cervix (hysterectomy). As a result of this procedure:  You may not be able to have children. However, if you are premenopausal (you have not gone through menopause): ? You may still have a small chance of getting pregnant. ? You will need to use a reliable method of birth control after the procedure to prevent pregnancy.  You may stop having a menstrual period, or you may have only a small amount of bleeding during your period. Menstruation may return several years after the procedure.  Tell a health care provider about:  Any allergies you have.  All medicines you are taking, including vitamins, herbs, eye drops, creams, and over-the-counter medicines.  Any problems you or family members have had with the use of anesthetic medicines.  Any blood disorders you have.  Any surgeries you have had.  Any medical conditions you have. What are the risks? Generally, this is a safe procedure. However, problems may occur, including:  A hole (perforation) in the uterus or bowel.  Infection of the uterus, bladder, or vagina.  Bleeding.  Damage to other structures or organs.  An air bubble in the lung (air embolus).  Problems with pregnancy after the procedure.  Failure of the procedure.  Decreased ability to diagnose cancer in the endometrium.  What happens before the procedure?  You will have tests of your endometrium to make sure there are no pre-cancerous cells or cancer cells present.  You may have an ultrasound of the uterus.  You may be given  medicines to thin the endometrium.  Ask your health care provider about: ? Changing or stopping your regular medicines. This is especially important if you take diabetes medicines or blood thinners. ? Taking medicines such as aspirin and ibuprofen. These medicines can thin your blood. Do not take these medicines before your procedure if your doctor tells you not to.  Plan to have someone take you home from the hospital or clinic. What happens during the procedure?  You will lie on an exam table with your feet and legs supported as in a pelvic exam.  To lower your risk of infection: ? Your health care team will wash or sanitize their hands and put on germ-free (sterile) gloves. ? Your genital area will be washed with soap.  An IV tube will be inserted into one of your veins.  You will be given a medicine to help you relax (sedative).  A surgical instrument with a light and camera (resectoscope) will be inserted into your vagina and moved into your uterus. This allows your surgeon to see inside your uterus.  Endometrial tissue will be removed using one of the following methods: ? Radiofrequency. This method uses a radiofrequency-alternating electric current to remove the endometrium. ? Cryotherapy. This method uses extreme cold to freeze the endometrium. ? Heated-free liquid. This method uses a heated saltwater (saline) solution to remove the endometrium. ? Microwave. This method uses high-energy microwaves to heat up the endometrium and remove it. ? Thermal balloon. This method involves inserting a catheter with a balloon tip into the uterus. The balloon tip is   filled with heated fluid to remove the endometrium. The procedure may vary among health care providers and hospitals. What happens after the procedure?  Your blood pressure, heart rate, breathing rate, and blood oxygen level will be monitored until the medicines you were given have worn off.  As tissue healing occurs, you may  notice vaginal bleeding for 4-6 weeks after the procedure. You may also experience: ? Cramps. ? Thin, watery vaginal discharge that is light pink or brown in color. ? A need to urinate more frequently than usual. ? Nausea.  Do not drive for 24 hours if you were given a sedative.  Do not have sex or insert anything into your vagina until your health care provider approves. Summary  Endometrial ablation is done to treat the many causes of heavy menstrual bleeding.  The procedure may be done only after medications have been tried to control the bleeding.  Plan to have someone take you home from the hospital or clinic. This information is not intended to replace advice given to you by your health care provider. Make sure you discuss any questions you have with your health care provider. Document Released: 11/14/2003 Document Revised: 01/22/2016 Document Reviewed: 01/22/2016 Elsevier Interactive Patient Education  2017 [employer]  

## 2017-08-03 NOTE — Op Note (Signed)
Preoperative diagnosis:  1.   Submucous myoma of the uterus                                         2.  Menorrhagia with regular cycle\                                         3.  Dysmenorrhea   Postoperative diagnoses: Same as above   Procedure: Hysteroscopy, uterine curettage, removal of submucosal myoma, endometrial ablation using Minerva  Surgeon: Florian Buff   Anesthesia: Laryngeal mask airway  Findings: There was a small submucosal myoma. The endometrium was otherwise normal.   Description of operation: The patient was taken to the operating room and placed in the supine position. She underwent general anesthesia using the laryngeal mask airway. She was placed in the dorsal lithotomy position and prepped and draped in the usual sterile fashion. A Graves speculum was placed and the anterior cervical lip was grasped with a single-tooth tenaculum. The cervix was dilated serially to allow passage of the hysteroscope. Diagnostic hysteroscopy was performed with findings as noted above. A vigorous uterine curettage was then performed and all tissue sent to pathology for evaluation. The submucosal myoma was removed with ring forceps and twisting, it was removed in 2 pieces and in total  I then proceeded to perform the Minerva endometrial ablation.   The uterus sounded to 9.0 cm The handpiece was attached to the Minerva power source/machine and the handpiece passed the checklist. The array was squeezed down to remove all of the air present.  The array was then place into the endometrial cavity and deployed to a length of 6.0 cm. The handpiece confirmed appropriate width by being in the green portion of the visual dial. The cervical cuff was then inflated to the point the CO2 indicator was in the green. The endometrial integrity check was then performed and integrity sequence was confirmed x 2. The heating was then begun and carried out for a total of 2 minutes(which is standard therapy  time). When the plasma cycle was finished,  the cervical cuff was deflated and the array was removed with tissue present on the silicon membrane. There was appropriate post Minerva bleeding and uterine discharge.     All of the equipment worked well throughout the procedure.  The patient was awakened from anesthesia and taken to the recovery room in good stable condition all counts were correct. She received 2 g of Ancef and 30 mg of Toradol preoperatively. She will be discharged from the recovery room and followed up in the office in 1- 2 weeks.   She can expect 4 weeks of post procedure bloody watery discharge  Florian Buff, MD   08/03/2017 3:21 PM

## 2017-08-03 NOTE — Anesthesia Procedure Notes (Signed)
Procedure Name: Intubation Date/Time: 08/03/2017 2:46 PM Performed by: Charmaine Downs, CRNA Pre-anesthesia Checklist: Patient identified, Patient being monitored, Timeout performed, Emergency Drugs available and Suction available Patient Re-evaluated:Patient Re-evaluated prior to induction Oxygen Delivery Method: Circle System Utilized Preoxygenation: Pre-oxygenation with 100% oxygen Induction Type: IV induction Ventilation: Mask ventilation without difficulty Laryngoscope Size: Mac and 3 Grade View: Grade II Tube type: Oral Tube size: 7.0 mm Number of attempts: 1 Airway Equipment and Method: stylet Placement Confirmation: ETT inserted through vocal cords under direct vision,  positive ETCO2 and breath sounds checked- equal and bilateral Secured at: 21 cm Tube secured with: Tape Dental Injury: Teeth and Oropharynx as per pre-operative assessment

## 2017-08-03 NOTE — H&P (Signed)
Preoperative History and Physical  Kristen Atkins is a 42 y.o. No obstetric history on file. with Patient's last menstrual period was 07/01/2017. admitted for a hysteroscopic removal of submucosal myoma and endometrial ablation .  Due to extremely heavy periods and dysmenorrhea Soils, bleeds sometimes for 2 weeks at a time Wears tampons and pads together  PMH:        Past Medical History:  Diagnosis Date  . Anxiety and depression   . Hypertension   . Hypothyroidism due to Hashimoto's thyroiditis   . Kidney stones     PSH:          Past Surgical History:  Procedure Laterality Date  . OVARIAN CYST SURGERY      POb/GynH:      OB History   None     SH:   Social History        Tobacco Use  . Smoking status: Current Every Day Smoker    Packs/day: 0.50    Years: 6.00    Pack years: 3.00    Types: Cigarettes    Start date: 09/16/2010  . Smokeless tobacco: Never Used  Substance Use Topics  . Alcohol use: No  . Drug use: No    FH:         Family History  Problem Relation Age of Onset  . Ovarian cancer Mother   . Stroke Father   . Hypertension Father   . Colon cancer Neg Hx      Allergies:       Allergies  Allergen Reactions  . Penicillins Hives and Other (See Comments)    Childhood Allergy     Medications:       Current Outpatient Medications:  .  ALPRAZolam (XANAX) 0.5 MG tablet, , Disp: , Rfl: 3 .  BELSOMRA 10 MG TABS, , Disp: , Rfl: 3 .  fluticasone (FLONASE) 50 MCG/ACT nasal spray, Place 1 spray into both nostrils daily as needed for allergies or rhinitis., Disp: , Rfl:  .  hydrochlorothiazide (HYDRODIURIL) 25 MG tablet, Take 25 mg by mouth daily., Disp: , Rfl:  .  ibuprofen (ADVIL,MOTRIN) 200 MG tablet, Take 800 mg by mouth every 6 (six) hours as needed for headache., Disp: , Rfl:  .  levothyroxine (SYNTHROID, LEVOTHROID) 200 MCG tablet, , Disp: , Rfl: 1 .  metoprolol (LOPRESSOR) 50 MG tablet, Take 50 mg  by mouth daily., Disp: , Rfl:  .  busPIRone (BUSPAR) 10 MG tablet, Take 10 mg by mouth 2 (two) times daily., Disp: , Rfl:  .  CLONAZEPAM PO, Take by mouth 2 (two) times daily. Pt unsure of strength, Disp: , Rfl:   Review of Systems:   Review of Systems  Constitutional: Negative for fever, chills, weight loss, malaise/fatigue and diaphoresis.  HENT: Negative for hearing loss, ear pain, nosebleeds, congestion, sore throat, neck pain, tinnitus and ear discharge.   Eyes: Negative for blurred vision, double vision, photophobia, pain, discharge and redness.  Respiratory: Negative for cough, hemoptysis, sputum production, shortness of breath, wheezing and stridor.   Cardiovascular: Negative for chest pain, palpitations, orthopnea, claudication, leg swelling and PND.  Gastrointestinal: Positive for abdominal pain. Negative for heartburn, nausea, vomiting, diarrhea, constipation, blood in stool and melena.  Genitourinary: Negative for dysuria, urgency, frequency, hematuria and flank pain.  Musculoskeletal: Negative for myalgias, back pain, joint pain and falls.  Skin: Negative for itching and rash.  Neurological: Negative for dizziness, tingling, tremors, sensory change, speech change, focal weakness, seizures, loss of consciousness, weakness  and headaches.  Endo/Heme/Allergies: Negative for environmental allergies and polydipsia. Does not bruise/bleed easily.  Psychiatric/Behavioral: Negative for depression, suicidal ideas, hallucinations, memory loss and substance abuse. The patient is not nervous/anxious and does not have insomnia.      PHYSICAL EXAM:  Blood pressure 126/88, pulse 90, height 5\' 4"  (1.626 m), weight 231 lb (104.8 kg), last menstrual period 07/01/2017.    Vitals reviewed. Constitutional: She is oriented to person, place, and time. She appears well-developed and well-nourished.  HENT:  Head: Normocephalic and atraumatic.  Right Ear: External ear normal.  Left Ear:  External ear normal.  Nose: Nose normal.  Mouth/Throat: Oropharynx is clear and moist.  Eyes: Conjunctivae and EOM are normal. Pupils are equal, round, and reactive to light. Right eye exhibits no discharge. Left eye exhibits no discharge. No scleral icterus.  Neck: Normal range of motion. Neck supple. No tracheal deviation present. No thyromegaly present.  Cardiovascular: Normal rate, regular rhythm, normal heart sounds and intact distal pulses.  Exam reveals no gallop and no friction rub.   No murmur heard. Respiratory: Effort normal and breath sounds normal. No respiratory distress. She has no wheezes. She has no rales. She exhibits no tenderness.  GI: Soft. Bowel sounds are normal. She exhibits no distension and no mass. There is tenderness. There is no rebound and no guarding.  Genitourinary:       Vulva is normal without lesions Vagina is pink moist without discharge Cervix normal in appearance and pap is normal Uterus is normal size, contour, position, consistency, mobility, non-tender Adnexa is negative with normal sized ovaries by sonogram  Musculoskeletal: Normal range of motion. She exhibits no edema and no tenderness.  Neurological: She is alert and oriented to person, place, and time. She has normal reflexes. She displays normal reflexes. No cranial nerve deficit. She exhibits normal muscle tone. Coordination normal.  Skin: Skin is warm and dry. No rash noted. No erythema. No pallor.  Psychiatric: She has a normal mood and affect. Her behavior is normal. Judgment and thought content normal.    Labs: Results for orders placed or performed during the hospital encounter of 07/28/17 (from the past 168 hour(s))  Urinalysis, Routine w reflex microscopic   Collection Time: 07/28/17  7:58 AM  Result Value Ref Range   Color, Urine YELLOW YELLOW   APPearance HAZY (A) CLEAR   Specific Gravity, Urine 1.014 1.005 - 1.030   pH 7.0 5.0 - 8.0   Glucose, UA NEGATIVE NEGATIVE mg/dL   Hgb  urine dipstick SMALL (A) NEGATIVE   Bilirubin Urine NEGATIVE NEGATIVE   Ketones, ur NEGATIVE NEGATIVE mg/dL   Protein, ur NEGATIVE NEGATIVE mg/dL   Nitrite NEGATIVE NEGATIVE   Leukocytes, UA MODERATE (A) NEGATIVE   RBC / HPF 0-5 0 - 5 RBC/hpf   WBC, UA 11-20 0 - 5 WBC/hpf   Bacteria, UA NONE SEEN NONE SEEN   Squamous Epithelial / LPF 11-20 0 - 5   Mucus PRESENT    Hyaline Casts, UA PRESENT   CBC   Collection Time: 07/28/17  8:00 AM  Result Value Ref Range   WBC 7.6 4.0 - 10.5 K/uL   RBC 4.99 3.87 - 5.11 MIL/uL   Hemoglobin 14.6 12.0 - 15.0 g/dL   HCT 42.7 36.0 - 46.0 %   MCV 85.6 78.0 - 100.0 fL   MCH 29.3 26.0 - 34.0 pg   MCHC 34.2 30.0 - 36.0 g/dL   RDW 14.6 11.5 - 15.5 %   Platelets 402 (H)  150 - 400 K/uL  Comprehensive metabolic panel   Collection Time: 07/28/17  8:00 AM  Result Value Ref Range   Sodium 138 135 - 145 mmol/L   Potassium 3.5 3.5 - 5.1 mmol/L   Chloride 109 98 - 111 mmol/L   CO2 19 (L) 22 - 32 mmol/L   Glucose, Bld 108 (H) 70 - 99 mg/dL   BUN 17 6 - 20 mg/dL   Creatinine, Ser 0.81 0.44 - 1.00 mg/dL   Calcium 8.7 (L) 8.9 - 10.3 mg/dL   Total Protein 7.7 6.5 - 8.1 g/dL   Albumin 3.3 (L) 3.5 - 5.0 g/dL   AST 23 15 - 41 U/L   ALT 11 0 - 44 U/L   Alkaline Phosphatase 67 38 - 126 U/L   Total Bilirubin 0.5 0.3 - 1.2 mg/dL   GFR calc non Af Amer >60 >60 mL/min   GFR calc Af Amer >60 >60 mL/min   Anion gap 10 5 - 15  hCG, quantitative, pregnancy   Collection Time: 07/28/17  8:00 AM  Result Value Ref Range   hCG, Beta Chain, Quant, S <1 <5 mIU/mL  Rapid HIV screen (HIV 1/2 Ab+Ag)   Collection Time: 07/28/17  8:00 AM  Result Value Ref Range   HIV-1 P24 Antigen - HIV24 NON REACTIVE NON REACTIVE   HIV 1/2 Antibodies NON REACTIVE NON REACTIVE   Interpretation (HIV Ag Ab)      A non reactive test result means that HIV 1 or HIV 2 antibodies and HIV 1 p24 antigen were not detected in the specimen.     EKG: No orders found for this or any previous  visit.  Imaging Studies:  Imaging Results  US Pelvis Transvanginal Non-ob (tv Only)  Result Date: 07/12/2017 CLINICAL DATA:  Patient with history of menorrhagia. Prior right oophorectomy. EXAM: TRANSABDOMINAL AND TRANSVAGINAL ULTRASOUND OF PELVIS TECHNIQUE: Both transabdominal and transvaginal ultrasound examinations of the pelvis were performed. Transabdominal technique was performed for global imaging of the pelvis including uterus, ovaries, adnexal regions, and pelvic cul-de-sac. It was necessary to proceed with endovaginal exam following the transabdominal exam to visualize the endometrium. COMPARISON:  None FINDINGS: Uterus Measurements: 10.4 x 5.6 x 5.5 cm. Within the lower uterine segment there is a 3.0 x 2.5 x 3.5 cm hypoechoic mass. Endometrium Thickness: 8 mm. Hypoechoic mass measuring 3.0 x 2.5 x 3.5 cm within the lower uterine segment which may the impressing upon or within the endometrial canal. Right ovary Surgically absent Left ovary Measurements: 2.9 x 2.6 x 3.2 cm. Normal appearance/no adnexal mass. Other findings No abnormal free fluid. IMPRESSION: There is a 3.5 cm hypoechoic mass within the lower uterine segment which may either be within the endometrium or impressing upon the endometrium. Considerations include large submucosal fibroid or endometrial mass. Further evaluation can be obtained with sonohysterography or pelvic MRI. Electronically Signed   By: Lovey Newcomer M.D.   On: 07/12/2017 14:30   US Pelvis (transabdominal Only)  Result Date: 07/12/2017 CLINICAL DATA:  Patient with history of menorrhagia. Prior right oophorectomy. EXAM: TRANSABDOMINAL AND TRANSVAGINAL ULTRASOUND OF PELVIS TECHNIQUE: Both transabdominal and transvaginal ultrasound examinations of the pelvis were performed. Transabdominal technique was performed for global imaging of the pelvis including uterus, ovaries, adnexal regions, and pelvic cul-de-sac. It was necessary to proceed with endovaginal exam  following the transabdominal exam to visualize the endometrium. COMPARISON:  None FINDINGS: Uterus Measurements: 10.4 x 5.6 x 5.5 cm. Within the lower uterine segment there is a 3.0 x  2.5 x 3.5 cm hypoechoic mass. Endometrium Thickness: 8 mm. Hypoechoic mass measuring 3.0 x 2.5 x 3.5 cm within the lower uterine segment which may the impressing upon or within the endometrial canal. Right ovary Surgically absent Left ovary Measurements: 2.9 x 2.6 x 3.2 cm. Normal appearance/no adnexal mass. Other findings No abnormal free fluid. IMPRESSION: There is a 3.5 cm hypoechoic mass within the lower uterine segment which may either be within the endometrium or impressing upon the endometrium. Considerations include large submucosal fibroid or endometrial mass. Further evaluation can be obtained with sonohysterography or pelvic MRI. Electronically Signed   By: Lovey Newcomer M.D.   On: 07/12/2017 14:30       Assessment: Submucous myoma of uterus  Menorrhagia with regular cycle  Dysmenorrhea    Plan: Hysteroscopic removal of submucosal myoma + Uterine curettage Minerva endometrial ablation  Florian Buff 07/12/2017 2:34 PM

## 2017-08-03 NOTE — Transfer of Care (Signed)
Immediate Anesthesia Transfer of Care Note  Patient: Kristen Atkins  Procedure(s) Performed: UTERINE CURETTAGE/HYSTEROSCOPY WITH MINERVA ABLATION/HYSTEROSCOPIC REMOVAL OF SUBMUCOSAL MYOMA (N/A )  Patient Location: PACU  Anesthesia Type:General  Level of Consciousness: awake and patient cooperative  Airway & Oxygen Therapy: Patient Spontanous Breathing and Patient connected to nasal cannula oxygen  Post-op Assessment: Report given to RN, Post -op Vital signs reviewed and stable and Patient moving all extremities  Post vital signs: Reviewed and stable  Last Vitals:  Vitals Value Taken Time  BP    Temp    Pulse 78 08/03/2017  3:34 PM  Resp    SpO2 91 % 08/03/2017  3:34 PM  Vitals shown include unvalidated device data.  Last Pain:  Vitals:   08/03/17 0851  TempSrc: Oral  PainSc: 0-No pain         Complications: No apparent anesthesia complications

## 2017-08-04 ENCOUNTER — Encounter (HOSPITAL_COMMUNITY): Payer: Self-pay | Admitting: Obstetrics & Gynecology

## 2017-08-08 DIAGNOSIS — Z029 Encounter for administrative examinations, unspecified: Secondary | ICD-10-CM

## 2017-08-15 ENCOUNTER — Ambulatory Visit (INDEPENDENT_AMBULATORY_CARE_PROVIDER_SITE_OTHER): Payer: Managed Care, Other (non HMO) | Admitting: Obstetrics & Gynecology

## 2017-08-15 ENCOUNTER — Encounter: Payer: Self-pay | Admitting: Obstetrics & Gynecology

## 2017-08-15 ENCOUNTER — Encounter: Payer: Managed Care, Other (non HMO) | Admitting: Obstetrics & Gynecology

## 2017-08-15 VITALS — BP 142/92 | HR 93 | Wt 226.0 lb

## 2017-08-15 DIAGNOSIS — Z9889 Other specified postprocedural states: Secondary | ICD-10-CM | POA: Diagnosis not present

## 2017-08-15 NOTE — Progress Notes (Signed)
  HPI: Patient returns for routine postoperative follow-up having undergone hysteroscopy uterine curettage endometrial ablation removal of myoma on 08/03/2017.  The patient's immediate postoperative recovery has been unremarkable. Since hospital discharge the patient reports minimal bleeding  Having some symptoms that are suspicious for gallbladder diasease.   Current Outpatient Medications: ALPRAZolam (XANAX) 0.5 MG tablet, Take 0.5 mg by mouth 2 (two) times daily as needed for anxiety. , Disp: , Rfl: 3 fluticasone (FLONASE) 50 MCG/ACT nasal spray, Place 1 spray into both nostrils daily. , Disp: , Rfl:  hydrochlorothiazide (HYDRODIURIL) 25 MG tablet, Take 25 mg by mouth daily., Disp: , Rfl:  HYDROcodone-acetaminophen (NORCO/VICODIN) 5-325 MG tablet, Take 1 tablet by mouth every 6 (six) hours as needed., Disp: 15 tablet, Rfl: 0 ketorolac (TORADOL) 10 MG tablet, Take 1 tablet (10 mg total) by mouth every 8 (eight) hours as needed., Disp: 15 tablet, Rfl: 0 levothyroxine (SYNTHROID, LEVOTHROID) 200 MCG tablet, Take 200 mcg by mouth daily before breakfast. , Disp: , Rfl: 1 metoprolol (LOPRESSOR) 50 MG tablet, Take 50 mg by mouth 2 (two) times daily. , Disp: , Rfl:  ondansetron (ZOFRAN ODT) 8 MG disintegrating tablet, Take 1 tablet (8 mg total) by mouth every 8 (eight) hours as needed for nausea or vomiting. (Patient not taking: Reported on 08/15/2017), Disp: 20 tablet, Rfl: 0  No current facility-administered medications for this visit.     Blood pressure (!) 142/92, pulse 93, weight 226 lb (102.5 kg).  Physical Exam: Vagina normal post ablation exam  Diagnostic Tests:   Pathology: benign  Impression: S/p hysteroscopy D&C endometrial ablation with removal of myoma  Plan:   Follow up: 1  years  Florian Buff, MD

## 2017-08-17 ENCOUNTER — Telehealth: Payer: Self-pay | Admitting: *Deleted

## 2017-08-17 ENCOUNTER — Emergency Department (HOSPITAL_COMMUNITY)
Admission: EM | Admit: 2017-08-17 | Discharge: 2017-08-17 | Disposition: A | Discharge status: Home / Self Care | Payer: Managed Care, Other (non HMO) | Attending: Emergency Medicine | Admitting: Emergency Medicine

## 2017-08-17 ENCOUNTER — Other Ambulatory Visit: Payer: Self-pay

## 2017-08-17 ENCOUNTER — Emergency Department (HOSPITAL_COMMUNITY): Payer: Managed Care, Other (non HMO)

## 2017-08-17 ENCOUNTER — Other Ambulatory Visit: Payer: Self-pay | Admitting: Obstetrics & Gynecology

## 2017-08-17 ENCOUNTER — Encounter (HOSPITAL_COMMUNITY): Payer: Self-pay

## 2017-08-17 DIAGNOSIS — F17210 Nicotine dependence, cigarettes, uncomplicated: Secondary | ICD-10-CM | POA: Diagnosis not present

## 2017-08-17 DIAGNOSIS — I10 Essential (primary) hypertension: Secondary | ICD-10-CM | POA: Insufficient documentation

## 2017-08-17 DIAGNOSIS — Z79899 Other long term (current) drug therapy: Secondary | ICD-10-CM | POA: Insufficient documentation

## 2017-08-17 DIAGNOSIS — R1011 Right upper quadrant pain: Secondary | ICD-10-CM

## 2017-08-17 DIAGNOSIS — N200 Calculus of kidney: Secondary | ICD-10-CM | POA: Diagnosis not present

## 2017-08-17 DIAGNOSIS — N3000 Acute cystitis without hematuria: Secondary | ICD-10-CM | POA: Diagnosis not present

## 2017-08-17 DIAGNOSIS — N3090 Cystitis, unspecified without hematuria: Secondary | ICD-10-CM

## 2017-08-17 DIAGNOSIS — E039 Hypothyroidism, unspecified: Secondary | ICD-10-CM | POA: Insufficient documentation

## 2017-08-17 DIAGNOSIS — R109 Unspecified abdominal pain: Secondary | ICD-10-CM | POA: Insufficient documentation

## 2017-08-17 LAB — CBC WITH DIFFERENTIAL/PLATELET
BASOS PCT: 1 %
Basophils Absolute: 0.1 10*3/uL (ref 0.0–0.1)
EOS ABS: 0.3 10*3/uL (ref 0.0–0.7)
EOS PCT: 3 %
HCT: 44.1 % (ref 36.0–46.0)
Hemoglobin: 15.4 g/dL — ABNORMAL HIGH (ref 12.0–15.0)
LYMPHS ABS: 0.8 10*3/uL (ref 0.7–4.0)
Lymphocytes Relative: 10 %
MCH: 30.1 pg (ref 26.0–34.0)
MCHC: 34.9 g/dL (ref 30.0–36.0)
MCV: 86.3 fL (ref 78.0–100.0)
Monocytes Absolute: 0.3 10*3/uL (ref 0.1–1.0)
Monocytes Relative: 4 %
Neutro Abs: 6.7 10*3/uL (ref 1.7–7.7)
Neutrophils Relative %: 82 %
PLATELETS: 397 10*3/uL (ref 150–400)
RBC: 5.11 MIL/uL (ref 3.87–5.11)
RDW: 14 % (ref 11.5–15.5)
WBC: 8.2 10*3/uL (ref 4.0–10.5)

## 2017-08-17 LAB — URINALYSIS, ROUTINE W REFLEX MICROSCOPIC
BILIRUBIN URINE: NEGATIVE
Bacteria, UA: NONE SEEN
GLUCOSE, UA: NEGATIVE mg/dL
Ketones, ur: NEGATIVE mg/dL
NITRITE: NEGATIVE
PH: 7 (ref 5.0–8.0)
Protein, ur: NEGATIVE mg/dL
SPECIFIC GRAVITY, URINE: 1.005 (ref 1.005–1.030)

## 2017-08-17 LAB — COMPREHENSIVE METABOLIC PANEL
ALK PHOS: 81 U/L (ref 38–126)
ALT: 12 U/L (ref 0–44)
ANION GAP: 10 (ref 5–15)
AST: 19 U/L (ref 15–41)
Albumin: 3.9 g/dL (ref 3.5–5.0)
BILIRUBIN TOTAL: 0.6 mg/dL (ref 0.3–1.2)
BUN: 13 mg/dL (ref 6–20)
CALCIUM: 9.5 mg/dL (ref 8.9–10.3)
CO2: 24 mmol/L (ref 22–32)
CREATININE: 0.86 mg/dL (ref 0.44–1.00)
Chloride: 101 mmol/L (ref 98–111)
Glucose, Bld: 112 mg/dL — ABNORMAL HIGH (ref 70–99)
Potassium: 3.6 mmol/L (ref 3.5–5.1)
Sodium: 135 mmol/L (ref 135–145)
TOTAL PROTEIN: 8.2 g/dL — AB (ref 6.5–8.1)

## 2017-08-17 LAB — LIPASE, BLOOD: LIPASE: 25 U/L (ref 11–51)

## 2017-08-17 MED ORDER — ONDANSETRON 8 MG PO TBDP
8.0000 mg | ORAL_TABLET | Freq: Once | ORAL | Status: AC
  Administered 2017-08-17: 8 mg via ORAL
  Filled 2017-08-17: qty 1

## 2017-08-17 MED ORDER — HYDROMORPHONE HCL 1 MG/ML IJ SOLN
1.0000 mg | Freq: Once | INTRAMUSCULAR | Status: AC
  Administered 2017-08-17: 1 mg via INTRAMUSCULAR
  Filled 2017-08-17: qty 1

## 2017-08-17 MED ORDER — NITROFURANTOIN MONOHYD MACRO 100 MG PO CAPS: 100.0000 mg | ORAL_CAPSULE | Freq: Two times a day (BID) | ORAL | 0 refills | Status: DC

## 2017-08-17 MED ORDER — KETOROLAC TROMETHAMINE 60 MG/2ML IM SOLN
60.0000 mg | Freq: Once | INTRAMUSCULAR | Status: AC
  Administered 2017-08-17: 60 mg via INTRAMUSCULAR
  Filled 2017-08-17: qty 2

## 2017-08-17 NOTE — ED Notes (Signed)
Pt ambulatory to the bathroom 

## 2017-08-17 NOTE — Telephone Encounter (Signed)
Patient called back stating she has left work because the pain in her abdomen is so bad.  Informed that Dr Elonda Husky put in order for gallbladder scan but will have to be scheduled.  States she is just going to go to the ER.

## 2017-08-17 NOTE — Discharge Instructions (Addendum)
Your CT scan showed an incidental finding(s):  "Bulky renal calculus within the right renal pelvis measures 14 x 10 mm." and  "Non-specific mild lymphadenopathy in the abdomen and pelvis. Currently this has a benign appearance. Recommend a follow-up CT abdomen and pelvis with IV contrast in 3-6 months to document stability." Take the prescription as directed.  Call the Urologist today to schedule a follow up appointment within the next 1 to 2 weeks for your "kidney stone." Call your regular medical doctor today to schedule a follow up appointment within the next week for a re-check of your abd pain and follow up your other incidental CT finding.  Return to the Emergency Department immediately sooner if worsening.

## 2017-08-17 NOTE — ED Notes (Signed)
Patient given discharge instruction, verbalized understand. Patient ambulatory out of the department.  

## 2017-08-17 NOTE — Telephone Encounter (Signed)
Patient states she is still experiencing pain in her right upper abdomen.  States she discussed this at her visit on Monday and it was mentioned that it could be her gall bladder.  Pain is not getting any better.  Patient asking what she needs to do next.  Please advise.

## 2017-08-17 NOTE — ED Provider Notes (Signed)
[facility] EMERGENCY DEPARTMENT Provider Note   CSN: [id number] Arrival date & time: 08/17/17  1139     History   Chief Complaint Chief Complaint  Patient presents with  . Flank Pain    HPI Kristen Atkins is a 42 y.o. female.   Flank Pain     Pt was seen at 1210.  Per pt, c/o gradual onset and persistence of constant RLQ abd "pain" since last month.  Has been associated with nausea. Describes the pain as occasional radiating into the right side of her lower back. Pt states the pain was present before she had Novasure procedure, was told she possibly had a gallbladder issue. Pt has hx right oophorectomy.  Denies vomiting/diarrhea, no fevers, no flank pain, no rash, no CP/SOB, no black or blood in stools, no dysuria/hematuria.     Past Medical History:  Diagnosis Date  . Anxiety and depression   . History of kidney stones   . Hypertension   . Hypothyroidism due to Hashimoto's thyroiditis     Patient Active Problem List   Diagnosis Date Noted  . Diarrhea 09/15/2016    Past Surgical History:  Procedure Laterality Date  . DILITATION & CURRETTAGE/HYSTROSCOPY WITH NOVASURE ABLATION N/A 08/03/2017   Procedure: UTERINE CURETTAGE/HYSTEROSCOPY WITH MINERVA ABLATION/HYSTEROSCOPIC REMOVAL OF SUBMUCOSAL MYOMA;  Surgeon: Florian Buff, MD;  Location: [facility];  Service: Gynecology;  Laterality: N/A;  . LAPAROSCOPIC OOPHERECTOMY Right   . OVARIAN CYST SURGERY       OB History   None      Home Medications    Prior to Admission medications   Medication Sig Start Date End Date Taking? Authorizing Provider  ALPRAZolam Duanne Moron) 0.5 MG tablet Take 0.5 mg by mouth 2 (two) times daily as needed for anxiety.  06/09/17   [provider]  fluticasone (FLONASE) 50 MCG/ACT nasal spray Place 1 spray into both nostrils daily.     [provider]  hydrochlorothiazide (HYDRODIURIL) 25 MG tablet Take 25 mg by mouth daily.    [provider]    HYDROcodone-acetaminophen (NORCO/VICODIN) 5-325 MG tablet Take 1 tablet by mouth every 6 (six) hours as needed. 08/03/17   Florian Buff, MD  ketorolac (TORADOL) 10 MG tablet Take 1 tablet (10 mg total) by mouth every 8 (eight) hours as needed. 08/03/17   Florian Buff, MD  levothyroxine (SYNTHROID, LEVOTHROID) 200 MCG tablet Take 200 mcg by mouth daily before breakfast.  06/06/17   [provider]  metoprolol (LOPRESSOR) 50 MG tablet Take 50 mg by mouth 2 (two) times daily.     [provider]  ondansetron (ZOFRAN ODT) 8 MG disintegrating tablet Take 1 tablet (8 mg total) by mouth every 8 (eight) hours as needed for nausea or vomiting. Patient not taking: Reported on 08/15/2017 08/03/17   Florian Buff, MD    Family History Family History  Problem Relation Age of Onset  . Ovarian cancer Mother   . Stroke Father   . Hypertension Father   . Colon cancer Neg Hx     Social History Social History   Tobacco Use  . Smoking status: Current Every Day Smoker    Packs/day: 0.50    Years: 6.00    Pack years: 3.00    Types: Cigarettes    Start date: 09/16/2010  . Smokeless tobacco: Never Used  Substance Use Topics  . Alcohol use: No  . Drug use: No     Allergies   Penicillins  Review of Systems Review of Systems  Genitourinary: Positive for flank pain.  ROS: Statement: All systems negative except as marked or noted in the HPI; Constitutional: Negative for fever and chills. ; ; Eyes: Negative for eye pain, redness and discharge. ; ; ENMT: Negative for ear pain, hoarseness, nasal congestion, sinus pressure and sore throat. ; ; Cardiovascular: Negative for chest pain, palpitations, diaphoresis, dyspnea and peripheral edema. ; ; Respiratory: Negative for cough, wheezing and stridor. ; ; Gastrointestinal: +nausea, abd pain. Negative for vomiting, diarrhea, blood in stool, hematemesis, jaundice and rectal bleeding. . ; ; Genitourinary: Negative for dysuria, flank pain and  hematuria. ; ; GYN:  No pelvic pain, no vaginal discharge, no vulvar pain.;; Musculoskeletal: Negative for back pain and neck pain. Negative for swelling and trauma.; ; Skin: Negative for pruritus, rash, abrasions, blisters, bruising and skin lesion.; ; Neuro: Negative for headache, lightheadedness and neck stiffness. Negative for weakness, altered level of consciousness, altered mental status, extremity weakness, paresthesias, involuntary movement, seizure and syncope.        Physical Exam Updated Vital Signs BP (!) 155/93 (BP Location: Right Arm)   Pulse 97   Temp 97.8 F (36.6 C) (Oral)   Resp 14   Wt 102.5 kg (226 lb)   SpO2 99%   BMI 38.79 kg/m   Physical Exam 1215: Physical examination:  Nursing notes reviewed; Vital signs and O2 SAT reviewed;  Constitutional: Well developed, Well nourished, Well hydrated, In no acute distress; Head:  Normocephalic, atraumatic; Eyes: EOMI, PERRL, No scleral icterus; ENMT: Mouth and pharynx normal, Mucous membranes moist; Neck: Supple, Full range of motion, No lymphadenopathy; Cardiovascular: Regular rate and rhythm, No gallop; Respiratory: Breath sounds clear & equal bilaterally, No wheezes.  Speaking full sentences with ease, Normal respiratory effort/excursion; Chest: Nontender, Movement normal; Abdomen: Soft, +RLQ tenderness to palp. No rebound or guarding. Nondistended, Normal bowel sounds; Genitourinary: No CVA tenderness; Extremities: Peripheral pulses normal, No tenderness, No edema, No calf edema or asymmetry.; Neuro: AA&Ox3, Major CN grossly intact.  Speech clear. No gross focal motor or sensory deficits in extremities.; Skin: Color normal, Warm, Dry.   ED Treatments / Results  Labs (all labs ordered are listed, but only abnormal results are displayed)   EKG None  Radiology   Procedures Procedures (including critical care time)  Medications Ordered in ED Medications  ketorolac (TORADOL) injection 60 mg (60 mg Intramuscular Given  08/17/17 1223)  HYDROmorphone (DILAUDID) injection 1 mg (1 mg Intramuscular Given 08/17/17 1224)  ondansetron (ZOFRAN-ODT) disintegrating tablet 8 mg (8 mg Oral Given 08/17/17 1222)     Initial Impression / Assessment and Plan / ED Course  I have reviewed the triage vital signs and the nursing notes.  Pertinent labs & imaging results that were available during my care of the patient were reviewed by me and considered in my medical decision making (see chart for details).  MDM Reviewed: previous chart, nursing note and vitals Reviewed previous: labs Interpretation: labs and CT scan   Results for orders placed or performed during the hospital encounter of 08/17/17  Urinalysis, Routine w reflex microscopic  Result Value Ref Range   Color, Urine STRAW (A) YELLOW   APPearance CLEAR CLEAR   Specific Gravity, Urine 1.005 1.005 - 1.030   pH 7.0 5.0 - 8.0   Glucose, UA NEGATIVE NEGATIVE mg/dL   Hgb urine dipstick MODERATE (A) NEGATIVE   Bilirubin Urine NEGATIVE NEGATIVE   Ketones, ur NEGATIVE NEGATIVE mg/dL   Protein, ur NEGATIVE NEGATIVE mg/dL  Nitrite NEGATIVE NEGATIVE   Leukocytes, UA SMALL (A) NEGATIVE   RBC / HPF 0-5 0 - 5 RBC/hpf   WBC, UA 21-50 0 - 5 WBC/hpf   Bacteria, UA NONE SEEN NONE SEEN   Squamous Epithelial / LPF 0-5 0 - 5  Comprehensive metabolic panel  Result Value Ref Range   Sodium 135 135 - 145 mmol/L   Potassium 3.6 3.5 - 5.1 mmol/L   Chloride 101 98 - 111 mmol/L   CO2 24 22 - 32 mmol/L   Glucose, Bld 112 (H) 70 - 99 mg/dL   BUN 13 6 - 20 mg/dL   Creatinine, Ser 0.86 0.44 - 1.00 mg/dL   Calcium 9.5 8.9 - 10.3 mg/dL   Total Protein 8.2 (H) 6.5 - 8.1 g/dL   Albumin 3.9 3.5 - 5.0 g/dL   AST 19 15 - 41 U/L   ALT 12 0 - 44 U/L   Alkaline Phosphatase 81 38 - 126 U/L   Total Bilirubin 0.6 0.3 - 1.2 mg/dL   GFR calc non Af Amer >60 >60 mL/min   GFR calc Af Amer >60 >60 mL/min   Anion gap 10 5 - 15  Lipase, blood  Result Value Ref Range   Lipase 25 11 - 51 U/L    CBC with Differential  Result Value Ref Range   WBC 8.2 4.0 - 10.5 K/uL   RBC 5.11 3.87 - 5.11 MIL/uL   Hemoglobin 15.4 (H) 12.0 - 15.0 g/dL   HCT 44.1 36.0 - 46.0 %   MCV 86.3 78.0 - 100.0 fL   MCH 30.1 26.0 - 34.0 pg   MCHC 34.9 30.0 - 36.0 g/dL   RDW 14.0 11.5 - 15.5 %   Platelets 397 150 - 400 K/uL   Neutrophils Relative % 82 %   Neutro Abs 6.7 1.7 - 7.7 K/uL   Lymphocytes Relative 10 %   Lymphs Abs 0.8 0.7 - 4.0 K/uL   Monocytes Relative 4 %   Monocytes Absolute 0.3 0.1 - 1.0 K/uL   Eosinophils Relative 3 %   Eosinophils Absolute 0.3 0.0 - 0.7 K/uL   Basophils Relative 1 %   Basophils Absolute 0.1 0.0 - 0.1 K/uL    Ct Renal Stone Study Result Date: 08/17/2017 CLINICAL DATA:  42 year old female with intermittent right flank pain, but progressive following uterine ablation earlier this month. EXAM: CT ABDOMEN AND PELVIS WITHOUT CONTRAST TECHNIQUE: Multidetector CT imaging of the abdomen and pelvis was performed following the standard protocol without IV contrast. COMPARISON:  Pelvis ultrasound 07/12/2017 FINDINGS: Lower chest: Cardiac size is at the upper limits of normal. Mild lung base atelectasis, less likely scarring. No pleural effusion or lung base consolidation. Hepatobiliary: Negative noncontrast liver and gallbladder. Pancreas: Negative. Spleen: Negative. Adrenals/Urinary Tract: Round 14 millimeter low-density (-5 Hounsfield units) left adrenal benign adenoma. Normal right adrenal gland. Negative noncontrast left kidney and course of the left ureter. Diminutive and unremarkable urinary bladder. Large oval 14 x 10 millimeter calculus within the right renal pelvis. Superimposed punctate lower pole nephrolithiasis. Asymmetric enlargement of the right renal calices without overt hydronephrosis or perinephric stranding. The proximal right ureter is asymmetrically larger than the left but no obstructing etiology is identified along the ureter course to the urinary bladder. No  periureteral stranding. Stomach/Bowel: Negative rectum aside from retained stool. Decompressed and negative descending and sigmoid colon. Negative transverse colon, right colon, and retrocecal appendix. Negative terminal ileum. Small bowel loops are within normal limits. Decompressed stomach and duodenum. No abdominal  free air, free fluid. Vascular/Lymphatic: Mild Aortoiliac calcified atherosclerosis. Vascular patency is not evaluated in the absence of IV contrast. Upper limits of normal upper abdominal lymph nodes about the SMA and both kidneys. Individual nodes in this region measure 9-10 millimeters short axis. Similar maximal common iliac artery lymph nodes bilaterally, also increased in number. Similar maximal inguinal lymph nodes measuring up to 14 millimeters in short axis and increased in number. No bulky or necrotic lymph nodes are identified. Reproductive: Normal noncontrast appearance of the uterus and ovaries. Other: No pelvic free fluid. Musculoskeletal: Benign appearing bone islands in the right iliac wing. Otherwise negative. IMPRESSION: 1. Bulky renal calculus within the right renal pelvis measures 14 x 10 mm and may be mildly obstructing the right renal calices. Multiple punctate right lower pole calculi. 2. Non-specific mild lymphadenopathy in the abdomen and pelvis. Currently this has a benign appearance, with top differential considerations including sarcoidosis or other benign lymphoproliferative disorder. Leukemia or lymphoma are less likely. Recommend a follow-up CT abdomen and pelvis with IV contrast in 3-6 months to document stability. Electronically Signed   By: Genevie Ann M.D.   On: 08/17/2017 12:57    1335: Udip with possible UTI, UC is pending. CT without ureteral calculi, doubt cause of symptoms today.  T/C returned from Uro Dr. Matilde Sprang, case discussed, including:  HPI, pertinent PM/SHx, VS/PE, dx testing, ED course and treatment:  Agrees with EDP, tx cystitis, and kidney calculus  will need office f/u in the next 1 to 2 weeks. Dx and testing, as well as d/w Uro MD, d/w pt and family.  Questions answered.  Verb understanding, agreeable to d/c home with outpt f/u.   Final Clinical Impressions(s) / ED Diagnoses   Final diagnoses:  None    ED Discharge Orders    None       Francine Graven, DO 08/21/17 2641

## 2017-08-17 NOTE — ED Triage Notes (Signed)
Pt has right sided flank pain. Had an ablation done on July 17th for a cyst on her ovary as well as fatty tissue. Went for a checkup on Monday and Dr. Elonda Husky stated it was probably her gallbladder. Pt does have a history of kidney stones though.

## 2017-08-17 NOTE — Telephone Encounter (Signed)
I placed an order for a gallbladder scan  Pt will need to call [facility] Radiology  [phone]  and set up appointment time  She will need to be fasting also

## 2017-08-20 LAB — URINE CULTURE

## 2017-08-21 ENCOUNTER — Telehealth: Payer: Self-pay | Admitting: Emergency Medicine

## 2017-08-21 NOTE — Telephone Encounter (Signed)
Post ED Visit - Positive Culture Follow-up  Culture report reviewed by antimicrobial stewardship pharmacist:  []  Elenor Quinones, Pharm.D. []  Heide Guile, Pharm.D., BCPS AQ-ID []  Parks Neptune, Pharm.D., BCPS []  Alycia Rossetti, Pharm.D., BCPS []  [place], Pharm.D., BCPS, AAHIVP []  Legrand Como, Pharm.D., BCPS, AAHIVP []  Salome Arnt, PharmD, BCPS []  Johnnette Gourd, PharmD, BCPS [x]  Hughes Better, PharmD, BCPS []  Leeroy Cha, PharmD  Positive urine culture Treated with nitrofurantoin, organism sensitive to the same and no further patient follow-up is required at this time.  Hazle Nordmann 08/21/2017, 12:14 PM

## 2017-08-29 ENCOUNTER — Ambulatory Visit (INDEPENDENT_AMBULATORY_CARE_PROVIDER_SITE_OTHER): Payer: Managed Care, Other (non HMO) | Admitting: Urology

## 2017-08-29 ENCOUNTER — Encounter: Payer: Self-pay | Admitting: Urology

## 2017-08-29 VITALS — BP 149/99 | HR 102 | Ht 64.0 in | Wt 227.1 lb

## 2017-08-29 DIAGNOSIS — N200 Calculus of kidney: Secondary | ICD-10-CM

## 2017-08-29 DIAGNOSIS — A599 Trichomoniasis, unspecified: Secondary | ICD-10-CM | POA: Diagnosis not present

## 2017-08-29 LAB — URINALYSIS, COMPLETE
Bilirubin, UA: NEGATIVE
Glucose, UA: NEGATIVE
KETONES UA: NEGATIVE
Nitrite, UA: NEGATIVE
PH UA: 6.5 (ref 5.0–7.5)
Protein, UA: NEGATIVE
SPEC GRAV UA: 1.02 (ref 1.005–1.030)
Urobilinogen, Ur: 0.2 mg/dL (ref 0.2–1.0)

## 2017-08-29 LAB — MICROSCOPIC EXAMINATION

## 2017-08-29 MED ORDER — FLAGYL 500 MG PO TABS: 500.0000 mg | ORAL_TABLET | Freq: Once | ORAL | 0 refills | Status: AC

## 2017-08-29 MED ORDER — METRONIDAZOLE 250 MG PO TABS: 2000.0000 mg | ORAL_TABLET | Freq: Once | ORAL | Status: DC

## 2017-08-29 NOTE — Progress Notes (Signed)
08/29/2017 4:11 PM   Kristen Atkins Jan 06, 1976 [id number]  Referring provider: Barry Dienes, NP [address] [place], [place] [address]  CC: Right renal stone, 1.4cm  HPI: I am seeing Ms. Kristen Atkins in urology clinic today for ER follow-up for right-sided flank pain.  Briefly, she was seen July 31 with a week of right-sided flank pain, and CT demonstrated a 1.4 cm x 1 cm right renal pelvis stone with some possible calyceal hydronephrosis.  She denies any symptoms of fever, chills, or urinary symptoms.  Her pain is well controlled with anti-inflammatories, and is mild to moderate in nature.  She does have a family history of kidney stones in her sister and father.  She denies dysuria, urgency, and frequency.   PMH: Past Medical History:  Diagnosis Date  . Anxiety and depression   . History of kidney stones   . Hypertension   . Hypothyroidism due to Hashimoto's thyroiditis     Surgical History: Past Surgical History:  Procedure Laterality Date  . DILITATION & CURRETTAGE/HYSTROSCOPY WITH NOVASURE ABLATION N/A 08/03/2017   Procedure: UTERINE CURETTAGE/HYSTEROSCOPY WITH MINERVA ABLATION/HYSTEROSCOPIC REMOVAL OF SUBMUCOSAL MYOMA;  Surgeon: Florian Buff, MD;  Location: [facility];  Service: Gynecology;  Laterality: N/A;  . LAPAROSCOPIC OOPHERECTOMY Right   . OVARIAN CYST SURGERY      Allergies:  Allergies  Allergen Reactions  . Penicillins Hives and Other (See Comments)    Childhood Allergy  Has patient had a PCN reaction causing immediate rash, facial/tongue/throat swelling, SOB or lightheadedness with hypotension: No Has patient had a PCN reaction causing severe rash involving mucus membranes or skin necrosis: Yes Has patient had a PCN reaction that required hospitalization: Yes Has patient had a PCN reaction occurring within the last 10 years: No If all of the above answers are "NO", then may proceed with Cephalosporin use.     Family History: Family History  Problem Relation  Age of Onset  . Ovarian cancer Mother   . Stroke Father   . Hypertension Father   . Colon cancer Neg Hx   . Bladder Cancer Neg Hx   . Kidney cancer Neg Hx     Social History:  reports that she has been smoking cigarettes. She started smoking about 6 years ago. She has a 3.00 pack-year smoking history. She has never used smokeless tobacco. She reports that she does not drink alcohol or use drugs.  ROS: Please see flowsheet from today's date for complete review of systems.  Physical Exam: BP (!) 149/99 (BP Location: Left Arm, Patient Position: Sitting, Cuff Size: Normal)   Pulse (!) 102   Ht 5\' 4"  (1.626 m)   Wt 227 lb 1.6 oz (103 kg)   BMI 38.98 kg/m    Constitutional:  Alert and oriented, No acute distress. Cardiovascular: No clubbing, cyanosis, or edema. Respiratory: Normal respiratory effort, no increased work of breathing. GI: Abdomen is soft, nontender, nondistended, no abdominal masses GU: RIGHT CVA tenderness Lymph: No cervical or inguinal lymphadenopathy. Skin: No rashes, bruises or suspicious lesions. Neurologic: Grossly intact, no focal deficits, moving all 4 extremities. Psychiatric: Normal mood and affect.  Laboratory Data: Lab Results  Component Value Date   WBC 8.2 08/17/2017   HGB 15.4 (H) 08/17/2017   HCT 44.1 08/17/2017   MCV 86.3 08/17/2017   PLT 397 08/17/2017    Lab Results  Component Value Date   CREATININE 0.86 08/17/2017     Urinalysis    Component Value Date/Time   COLORURINE STRAW (A)  08/17/2017 [place] 08/17/2017 1213   LABSPEC 1.005 08/17/2017 1213   PHURINE 7.0 08/17/2017 1213   GLUCOSEU NEGATIVE 08/17/2017 1213   HGBUR MODERATE (A) 08/17/2017 1213   BILIRUBINUR NEGATIVE 08/17/2017 1213   KETONESUR NEGATIVE 08/17/2017 1213   PROTEINUR NEGATIVE 08/17/2017 1213   NITRITE NEGATIVE 08/17/2017 1213   LEUKOCYTESUR SMALL (A) 08/17/2017 1213    Lab Results  Component Value Date   BACTERIA NONE SEEN 08/17/2017     Pertinent Imaging: I have personally reviewed the CT from 08/17/2017. Right 1.4cm renal pelvis stone, 1000 HU, 15.5 SSD. No significant hydro noted.  Assessment & Plan:   In summary, Ms. [name] is a 42 year old female with a 1.4cm right renal pelvis stone likely causing intermittent obstruction and flank pain.  She is afebrile with no systemic or laboratory signs of infection. No leukocytosis or AKI.  We discussed various treatment options for urolithiasis including observation with or without medical expulsive therapy, shockwave lithotripsy (SWL), ureteroscopy and laser lithotripsy with stent placement, and percutaneous nephrolithotomy.  We discussed that management is based on stone size, location, density, patient co-morbidities, and patient preference.   SWL has a lower stone free rate in a single procedure, but also a lower complication rate compared to ureteroscopy and avoids a stent and associated stent related symptoms. Possible complications include renal hematoma, steinstrasse, and need for additional treatment.  Ureteroscopy with laser lithotripsy and stent placement has a higher stone free rate than SWL in a single procedure, however increased complication rate including possible infection, ureteral injury, bleeding, and stent related morbidity. Common stent related symptoms include dysuria, urgency/frequency, and flank pain.  After an extensive discussion of the risks and benefits of the above treatment options, the patient would like to proceed with right ureteroscopy, laser lithotripsy, and stent placement.  1. Kidney stones -Schedule for Right URS/LL/stent   2. Trichomoniasis -Incidentally found on urinalysis -Flagyl 2g x1, also provided additional prescription for partner treatment -Gonorrhea/Chlamydia screen   Billey Co, [place] [address], [place], [place] [address] [phone]

## 2017-08-30 ENCOUNTER — Other Ambulatory Visit: Payer: Self-pay | Admitting: Radiology

## 2017-08-30 DIAGNOSIS — N200 Calculus of kidney: Secondary | ICD-10-CM

## 2017-09-14 ENCOUNTER — Encounter
Admission: RE | Admit: 2017-09-14 | Discharge: 2017-09-14 | Disposition: A | Discharge status: Home / Self Care | Payer: Managed Care, Other (non HMO) | Source: Ambulatory Visit | Attending: Urology | Admitting: Urology

## 2017-09-14 ENCOUNTER — Other Ambulatory Visit: Payer: Self-pay

## 2017-09-14 DIAGNOSIS — N200 Calculus of kidney: Secondary | ICD-10-CM | POA: Diagnosis present

## 2017-09-14 DIAGNOSIS — Z880 Allergy status to penicillin: Secondary | ICD-10-CM | POA: Diagnosis not present

## 2017-09-14 DIAGNOSIS — Z79899 Other long term (current) drug therapy: Secondary | ICD-10-CM | POA: Diagnosis not present

## 2017-09-14 DIAGNOSIS — F17210 Nicotine dependence, cigarettes, uncomplicated: Secondary | ICD-10-CM | POA: Diagnosis not present

## 2017-09-14 DIAGNOSIS — E063 Autoimmune thyroiditis: Secondary | ICD-10-CM | POA: Diagnosis not present

## 2017-09-14 DIAGNOSIS — E039 Hypothyroidism, unspecified: Secondary | ICD-10-CM | POA: Diagnosis not present

## 2017-09-14 DIAGNOSIS — I10 Essential (primary) hypertension: Secondary | ICD-10-CM | POA: Diagnosis not present

## 2017-09-14 DIAGNOSIS — F419 Anxiety disorder, unspecified: Secondary | ICD-10-CM | POA: Diagnosis not present

## 2017-09-14 DIAGNOSIS — F329 Major depressive disorder, single episode, unspecified: Secondary | ICD-10-CM | POA: Diagnosis not present

## 2017-09-14 HISTORY — DX: Other specified postprocedural states: Z98.890

## 2017-09-14 HISTORY — DX: Nausea with vomiting, unspecified: R11.2

## 2017-09-14 HISTORY — DX: Pneumonia, unspecified organism: J18.9

## 2017-09-14 NOTE — Patient Instructions (Signed)
Your procedure is scheduled on: Friday, September 16, 2017 Report to Day Surgery on the 2nd floor of the [facility]. To find out your arrival time, please call [phone] between 1PM - 3PM on: Thursday, September 15, 2017  REMEMBER: Instructions that are not followed completely may result in serious medical risk, up to and including death; or upon the discretion of your surgeon and anesthesiologist your surgery may need to be rescheduled.  Do not eat food after midnight the night before surgery.  No gum chewing, lozengers or hard candies.  You may however, drink CLEAR liquids up to 2 hours before you are scheduled to arrive for your surgery. Do not drink anything within 2 hours of the start of your surgery.  Clear liquids include: - water  - apple juice without pulp - gatorade - black coffee or tea (Do NOT add milk or creamers to the coffee or tea) Do NOT drink anything that is not on this list.  No Alcohol for 24 hours before or after surgery.  No Smoking including e-cigarettes for 24 hours prior to surgery.  No chewable tobacco products for at least 6 hours prior to surgery.  No nicotine patches on the day of surgery.  On the morning of surgery brush your teeth with toothpaste and water, you may rinse your mouth with mouthwash if you wish. Do not swallow any toothpaste or mouthwash.  Notify your doctor if there is any change in your medical condition (cold, fever, infection).  Do not wear jewelry, make-up, hairpins, clips or nail polish.  Do not wear lotions, powders, or perfumes. You may wear deodorant.  Do not shave 48 hours prior to surgery.   Contacts and dentures may not be worn into surgery.  Do not bring valuables to the hospital, including drivers license, insurance or credit cards.  [facility] is not responsible for any belongings or valuables.   TAKE THESE MEDICATIONS THE MORNING OF SURGERY:  1.  Levothyroxine 2.  Metoprolol  NOW!  Stop Anti-inflammatories  (NSAIDS) such as Advil, Aleve, Ibuprofen, Motrin, Naproxen, Naprosyn and Aspirin based products such as Excedrin, Goodys Powder, BC Powder. (May take Tylenol or Acetaminophen if needed.)  NOW!  Stop ANY OVER THE COUNTER supplements until after surgery.  Wear comfortable clothing (specific to your surgery type) to the hospital.  If you are being discharged the day of surgery, you will not be allowed to drive home. You will need a responsible adult to drive you home and stay with you that night.   If you are taking public transportation, you will need to have a responsible adult with you. Please confirm with your physician that it is acceptable to use public transportation.   Please call [phone] if you have any questions about these instructions.

## 2017-09-15 ENCOUNTER — Telehealth: Payer: Self-pay

## 2017-09-15 LAB — URINE CULTURE

## 2017-09-15 MED ORDER — CIPROFLOXACIN IN D5W 400 MG/200ML IV SOLN
400.0000 mg | INTRAVENOUS | Status: AC
  Administered 2017-09-16: 400 mg via INTRAVENOUS

## 2017-09-15 MED ORDER — NITROFURANTOIN MONOHYD MACRO 100 MG PO CAPS: 100.0000 mg | ORAL_CAPSULE | Freq: Two times a day (BID) | ORAL | 0 refills | Status: DC

## 2017-09-15 NOTE — Telephone Encounter (Signed)
-----   Message from Billey Co, MD sent at 09/15/2017  8:39 AM EDT ----- Regarding: new abx pre-op Please start her on 3 days of nitrofurantoin 100mg  BID starting this AM to cover her recent positive urine cx prior to surgery this Friday.  Thanks!   Billey Co, MD 09/15/2017

## 2017-09-15 NOTE — Telephone Encounter (Signed)
Left detailed message, rx sent to pharmacy.

## 2017-09-16 ENCOUNTER — Ambulatory Visit
Admission: RE | Admit: 2017-09-16 | Discharge: 2017-09-16 | Disposition: A | Discharge status: Home / Self Care | Payer: Managed Care, Other (non HMO) | Source: Ambulatory Visit | Attending: Urology | Admitting: Urology

## 2017-09-16 ENCOUNTER — Ambulatory Visit: Payer: Managed Care, Other (non HMO) | Admitting: Registered Nurse

## 2017-09-16 ENCOUNTER — Inpatient Hospital Stay: Admission: RE | Admit: 2017-09-16 | Payer: Managed Care, Other (non HMO) | Source: Ambulatory Visit

## 2017-09-16 ENCOUNTER — Encounter
Admission: RE | Disposition: A | Discharge status: Home / Self Care | Payer: Self-pay | Source: Ambulatory Visit | Attending: Urology

## 2017-09-16 DIAGNOSIS — F329 Major depressive disorder, single episode, unspecified: Secondary | ICD-10-CM | POA: Insufficient documentation

## 2017-09-16 DIAGNOSIS — F17210 Nicotine dependence, cigarettes, uncomplicated: Secondary | ICD-10-CM | POA: Insufficient documentation

## 2017-09-16 DIAGNOSIS — N200 Calculus of kidney: Secondary | ICD-10-CM | POA: Diagnosis not present

## 2017-09-16 DIAGNOSIS — F419 Anxiety disorder, unspecified: Secondary | ICD-10-CM | POA: Insufficient documentation

## 2017-09-16 DIAGNOSIS — I10 Essential (primary) hypertension: Secondary | ICD-10-CM | POA: Insufficient documentation

## 2017-09-16 DIAGNOSIS — E039 Hypothyroidism, unspecified: Secondary | ICD-10-CM | POA: Insufficient documentation

## 2017-09-16 DIAGNOSIS — Z79899 Other long term (current) drug therapy: Secondary | ICD-10-CM | POA: Insufficient documentation

## 2017-09-16 DIAGNOSIS — E063 Autoimmune thyroiditis: Secondary | ICD-10-CM | POA: Insufficient documentation

## 2017-09-16 DIAGNOSIS — Z880 Allergy status to penicillin: Secondary | ICD-10-CM | POA: Insufficient documentation

## 2017-09-16 HISTORY — PX: CYSTOSCOPY/URETEROSCOPY/HOLMIUM LASER/STENT PLACEMENT: SHX6546

## 2017-09-16 LAB — POCT PREGNANCY, URINE: Preg Test, Ur: NEGATIVE

## 2017-09-16 SURGERY — CYSTOSCOPY/URETEROSCOPY/HOLMIUM LASER/STENT PLACEMENT
Anesthesia: General | Laterality: Right | Wound class: Clean Contaminated

## 2017-09-16 MED ORDER — FAMOTIDINE 20 MG PO TABS
ORAL_TABLET | ORAL | Status: AC
  Filled 2017-09-16: qty 1

## 2017-09-16 MED ORDER — SULFAMETHOXAZOLE-TRIMETHOPRIM 800-160 MG PO TABS: 1.0000 | ORAL_TABLET | Freq: Every day | ORAL | 0 refills | Status: AC

## 2017-09-16 MED ORDER — PROPOFOL 500 MG/50ML IV EMUL
INTRAVENOUS | Status: AC
  Filled 2017-09-16: qty 50

## 2017-09-16 MED ORDER — MIDAZOLAM HCL 2 MG/2ML IJ SOLN
INTRAMUSCULAR | Status: DC | PRN
  Administered 2017-09-16: 2 mg via INTRAVENOUS

## 2017-09-16 MED ORDER — ROCURONIUM BROMIDE 50 MG/5ML IV SOLN
INTRAVENOUS | Status: AC
  Filled 2017-09-16: qty 1

## 2017-09-16 MED ORDER — ONDANSETRON HCL 4 MG/2ML IJ SOLN
INTRAMUSCULAR | Status: DC | PRN
  Administered 2017-09-16: 4 mg via INTRAVENOUS

## 2017-09-16 MED ORDER — PROMETHAZINE HCL 25 MG/ML IJ SOLN: 6.2500 mg | INTRAMUSCULAR | Status: DC | PRN

## 2017-09-16 MED ORDER — FAMOTIDINE 20 MG PO TABS
20.0000 mg | ORAL_TABLET | Freq: Once | ORAL | Status: AC
  Administered 2017-09-16: 20 mg via ORAL

## 2017-09-16 MED ORDER — SEVOFLURANE IN SOLN
RESPIRATORY_TRACT | Status: AC
  Filled 2017-09-16: qty 250

## 2017-09-16 MED ORDER — PROPOFOL 10 MG/ML IV BOLUS
INTRAVENOUS | Status: DC | PRN
  Administered 2017-09-16: 160 mg via INTRAVENOUS

## 2017-09-16 MED ORDER — TAMSULOSIN HCL 0.4 MG PO CAPS: 0.4000 mg | ORAL_CAPSULE | Freq: Every day | ORAL | 0 refills | Status: AC

## 2017-09-16 MED ORDER — PROPOFOL 500 MG/50ML IV EMUL
INTRAVENOUS | Status: DC | PRN
  Administered 2017-09-16: 150 ug/kg/min via INTRAVENOUS

## 2017-09-16 MED ORDER — FENTANYL CITRATE (PF) 100 MCG/2ML IJ SOLN
INTRAMUSCULAR | Status: AC
  Filled 2017-09-16: qty 2

## 2017-09-16 MED ORDER — MIDAZOLAM HCL 2 MG/2ML IJ SOLN
INTRAMUSCULAR | Status: AC
  Filled 2017-09-16: qty 2

## 2017-09-16 MED ORDER — SCOPOLAMINE 1 MG/3DAYS TD PT72
1.0000 | MEDICATED_PATCH | Freq: Once | TRANSDERMAL | Status: DC
  Administered 2017-09-16: 1.5 mg via TRANSDERMAL

## 2017-09-16 MED ORDER — ACETAMINOPHEN 10 MG/ML IV SOLN
INTRAVENOUS | Status: AC
  Filled 2017-09-16: qty 100

## 2017-09-16 MED ORDER — ONDANSETRON HCL 4 MG/2ML IJ SOLN
INTRAMUSCULAR | Status: AC
  Filled 2017-09-16: qty 2

## 2017-09-16 MED ORDER — LIDOCAINE HCL (CARDIAC) PF 100 MG/5ML IV SOSY
PREFILLED_SYRINGE | INTRAVENOUS | Status: DC | PRN
  Administered 2017-09-16: 100 mg via INTRAVENOUS

## 2017-09-16 MED ORDER — LIDOCAINE HCL (PF) 2 % IJ SOLN
INTRAMUSCULAR | Status: AC
  Filled 2017-09-16: qty 10

## 2017-09-16 MED ORDER — DEXAMETHASONE SODIUM PHOSPHATE 10 MG/ML IJ SOLN
INTRAMUSCULAR | Status: DC | PRN
  Administered 2017-09-16: 10 mg via INTRAVENOUS

## 2017-09-16 MED ORDER — HYDROCODONE-ACETAMINOPHEN 5-325 MG PO TABS: 1.0000 | ORAL_TABLET | ORAL | 0 refills | Status: AC | PRN

## 2017-09-16 MED ORDER — PROPOFOL 10 MG/ML IV BOLUS
INTRAVENOUS | Status: AC
  Filled 2017-09-16: qty 20

## 2017-09-16 MED ORDER — SCOPOLAMINE 1 MG/3DAYS TD PT72
MEDICATED_PATCH | TRANSDERMAL | Status: AC
  Administered 2017-09-16: 1.5 mg via TRANSDERMAL
  Filled 2017-09-16: qty 1

## 2017-09-16 MED ORDER — PROPOFOL 500 MG/50ML IV EMUL
INTRAVENOUS | Status: AC
  Filled 2017-09-16: qty 100

## 2017-09-16 MED ORDER — IOPAMIDOL (ISOVUE-200) INJECTION 41%
INTRAVENOUS | Status: DC | PRN
  Administered 2017-09-16: 15 mL

## 2017-09-16 MED ORDER — LACTATED RINGERS IV SOLN
INTRAVENOUS | Status: DC
  Administered 2017-09-16: 08:00:00 via INTRAVENOUS

## 2017-09-16 MED ORDER — ROCURONIUM BROMIDE 100 MG/10ML IV SOLN
INTRAVENOUS | Status: DC | PRN
  Administered 2017-09-16: 20 mg via INTRAVENOUS
  Administered 2017-09-16: 50 mg via INTRAVENOUS

## 2017-09-16 MED ORDER — SUGAMMADEX SODIUM 500 MG/5ML IV SOLN
INTRAVENOUS | Status: DC | PRN
  Administered 2017-09-16: 450 mg via INTRAVENOUS

## 2017-09-16 MED ORDER — ACETAMINOPHEN 10 MG/ML IV SOLN
INTRAVENOUS | Status: DC | PRN
  Administered 2017-09-16: 1000 mg via INTRAVENOUS

## 2017-09-16 MED ORDER — DEXAMETHASONE SODIUM PHOSPHATE 10 MG/ML IJ SOLN
INTRAMUSCULAR | Status: AC
  Filled 2017-09-16: qty 1

## 2017-09-16 MED ORDER — FENTANYL CITRATE (PF) 100 MCG/2ML IJ SOLN: 25.0000 ug | INTRAMUSCULAR | Status: DC | PRN

## 2017-09-16 MED ORDER — SUGAMMADEX SODIUM 200 MG/2ML IV SOLN
INTRAVENOUS | Status: AC
  Filled 2017-09-16: qty 2

## 2017-09-16 MED ORDER — FENTANYL CITRATE (PF) 100 MCG/2ML IJ SOLN
INTRAMUSCULAR | Status: DC | PRN
  Administered 2017-09-16: 50 ug via INTRAVENOUS

## 2017-09-16 MED ORDER — CIPROFLOXACIN IN D5W 400 MG/200ML IV SOLN
INTRAVENOUS | Status: AC
  Filled 2017-09-16: qty 200

## 2017-09-16 SURGICAL SUPPLY — 33 items
BAG DRAIN CYSTO-URO LG1000N (MISCELLANEOUS) ×2 IMPLANT
BASKET ZERO TIP 1.9FR (BASKET) IMPLANT
BRUSH SCRUB EZ 1% IODOPHOR (MISCELLANEOUS) ×2 IMPLANT
BULB IRRIG PATHFIND (MISCELLANEOUS) ×2 IMPLANT
CATH URETL 5X70 OPEN END (CATHETERS) IMPLANT
CNTNR SPEC 2.5X3XGRAD LEK (MISCELLANEOUS)
CONRAY 43 FOR UROLOGY 50M (MISCELLANEOUS) IMPLANT
CONT SPEC 4OZ STER OR WHT (MISCELLANEOUS)
CONTAINER SPEC 2.5X3XGRAD LEK (MISCELLANEOUS) IMPLANT
DRAPE UTILITY 15X26 TOWEL STRL (DRAPES) ×2 IMPLANT
FIBER LASER LITHO 273 (Laser) IMPLANT
GLOVE BIO SURGEON STRL SZ7.5 (GLOVE) ×4 IMPLANT
GOWN STRL REUS W/ TWL LRG LVL3 (GOWN DISPOSABLE) ×2 IMPLANT
GOWN STRL REUS W/ TWL XL LVL3 (GOWN DISPOSABLE) ×1 IMPLANT
GOWN STRL REUS W/TWL LRG LVL3 (GOWN DISPOSABLE) ×2
GOWN STRL REUS W/TWL XL LVL3 (GOWN DISPOSABLE) ×1
GUIDEWIRE GREEN .038 145CM (MISCELLANEOUS) IMPLANT
INTRODUCER DILATOR DOUBLE (INTRODUCER) ×2 IMPLANT
KIT TURNOVER CYSTO (KITS) ×2 IMPLANT
PACK CYSTO AR (MISCELLANEOUS) ×2 IMPLANT
SCOPE LITHOVU DISP 9.5FR 7.7FR (UROLOGICAL SUPPLIES) ×1 IMPLANT
SCOPE LITHOVUE DISPOSABLE (UROLOGICAL SUPPLIES) ×1
SENSORWIRE 0.038 NOT ANGLED (WIRE) ×4
SET CYSTO W/LG BORE CLAMP LF (SET/KITS/TRAYS/PACK) ×2 IMPLANT
SHEATH URETERAL 12FRX35CM (MISCELLANEOUS) ×2 IMPLANT
SOL .9 NS 3000ML IRR  AL (IV SOLUTION) ×1
SOL .9 NS 3000ML IRR UROMATIC (IV SOLUTION) ×1 IMPLANT
STENT URET 6FRX24 CONTOUR (STENTS) ×2 IMPLANT
STENT URET 6FRX26 CONTOUR (STENTS) IMPLANT
SURGILUBE 2OZ TUBE FLIPTOP (MISCELLANEOUS) ×2 IMPLANT
SYR 10ML LL (SYRINGE) ×2 IMPLANT
WATER STERILE IRR 1000ML POUR (IV SOLUTION) ×2 IMPLANT
WIRE SENSOR 0.038 NOT ANGLED (WIRE) ×2 IMPLANT

## 2017-09-16 NOTE — H&P (Signed)
UROLOGY H&P  Agree with H&P 08/29/2017.  Cardiac: RRR Lungs: CTA bilaterally  Plan for RIGHT URS/LL/Stent today for 1.4cm RIGHT renal stone  Urine cx 8/28 with 40k group B strep, has been on cx appropriate abx, will proceed  Informed consent obtained, discussed risks of bleeding, infection, need for staged procedure, stent related symptoms.  Billey Co, MD 09/16/2017

## 2017-09-16 NOTE — Anesthesia Preprocedure Evaluation (Addendum)
Anesthesia Evaluation  Patient identified by MRN, date of birth, ID band Patient awake    Reviewed: Allergy & Precautions, H&P , NPO status , Patient's Chart, lab work & pertinent test results  History of Anesthesia Complications (+) PONV and history of anesthetic complications  Airway Mallampati: III  TM Distance: >3 FB Neck ROM: full    Dental no notable dental hx. (+) Teeth Intact   Pulmonary neg pulmonary ROS, neg COPD, Current Smoker,    breath sounds clear to auscultation       Cardiovascular hypertension, (-) CAD, (-) Past MI and (-) CABG negative cardio ROS   Rhythm:regular Rate:Normal     Neuro/Psych Anxiety negative neurological ROS  negative psych ROS   GI/Hepatic negative GI ROS, Neg liver ROS,   Endo/Other  negative endocrine ROSHypothyroidism   Renal/GU negative Renal ROS  negative genitourinary   Musculoskeletal   Abdominal   Peds  Hematology negative hematology ROS (+)   Anesthesia Other Findings Past Medical History: No date: Anxiety and depression No date: History of kidney stones No date: Hypertension No date: Hypothyroidism due to Hashimoto's thyroiditis No date: Pneumonia No date: PONV (postoperative nausea and vomiting)  Past Surgical History: 08/03/2017: DILITATION & CURRETTAGE/HYSTROSCOPY WITH NOVASURE  ABLATION; N/A     Comment:  Procedure: UTERINE CURETTAGE/HYSTEROSCOPY WITH MINERVA               ABLATION/HYSTEROSCOPIC REMOVAL OF SUBMUCOSAL MYOMA;                Surgeon: Florian Buff, MD;  Location: [facility];  Service:              Gynecology;  Laterality: N/A; 2000: LAPAROSCOPIC OOPHERECTOMY; Right 2000: OVARIAN CYST SURGERY; Right     Reproductive/Obstetrics negative OB ROS                            Anesthesia Physical Anesthesia Plan  ASA: II  Anesthesia Plan: General   Post-op Pain Management:    Induction:   PONV Risk Score and Plan:    Airway Management Planned:   Additional Equipment:   Intra-op Plan:   Post-operative Plan:   Informed Consent: I have reviewed the patients History and Physical, chart, labs and discussed the procedure including the risks, benefits and alternatives for the proposed anesthesia with the patient or authorized representative who has indicated his/her understanding and acceptance.   Dental Advisory Given  Plan Discussed with: Anesthesiologist, CRNA and Surgeon  Anesthesia Plan Comments:        Anesthesia Quick Evaluation

## 2017-09-16 NOTE — Anesthesia Procedure Notes (Signed)
Procedure Name: Intubation Date/Time: 09/16/2017 9:35 AM Performed by: Doreen Salvage, CRNA Pre-anesthesia Checklist: Patient identified, Patient being monitored, Timeout performed, Emergency Drugs available and Suction available Patient Re-evaluated:Patient Re-evaluated prior to induction Oxygen Delivery Method: Circle system utilized Preoxygenation: Pre-oxygenation with 100% oxygen Induction Type: IV induction Ventilation: Mask ventilation without difficulty Laryngoscope Size: Mac and 3 Grade View: Grade I Tube type: Oral Tube size: 7.0 mm Number of attempts: 1 Airway Equipment and Method: Stylet Placement Confirmation: ETT inserted through vocal cords under direct vision,  positive ETCO2 and breath sounds checked- equal and bilateral Secured at: 21 cm Tube secured with: Tape Dental Injury: Teeth and Oropharynx as per pre-operative assessment

## 2017-09-16 NOTE — Op Note (Signed)
Date of procedure: 09/16/17  Preoperative diagnosis:  1. Right 1.4 cm renal pelvis stone  Postoperative diagnosis:  1. Same  Procedure: 1. Cystoscopy, right ureteroscopy, laser lithotripsy, ureteral stent placement 2. Retrograde pyelogram with intraoperative interpretation  Surgeon: Nickolas Madrid, MD  Anesthesia: General  Complications: None  Intraoperative findings:  1. Normal cystoscopy 2. Uncomplicated right ureteroscopy and dusting of right renal stone 3. Uncomplicated 6 French X 24 cm stent  EBL: Minimal  Specimens: None  Drains: Right 6 French X 24 stent  Indication: Kristen Atkins is a 42 y.o. patient with right-sided flank pain and a 1.4 cm right renal pelvis stone on CT.  After reviewing the management options for treatment, they elected to proceed with the above surgical procedure(s). We have discussed the potential benefits and risks of the procedure, side effects of the proposed treatment, the likelihood of the patient achieving the goals of the procedure, and any potential problems that might occur during the procedure or recuperation. Informed consent has been obtained.  Description of procedure:  The patient was taken to the operating room and general anesthesia was induced.  The patient was placed in the dorsal lithotomy position, prepped and draped in the usual sterile fashion, and preoperative antibiotics(ciprofloxacin) were administered. A preoperative time-out was performed.   A 21 French rigid cystoscope was inserted into the bladder and thorough cystoscopy did not demonstrate any concerning findings.  A 0.035 sensor wire was advanced up the right ureter under fluoroscopic vision and passed easily alongside the stone into the collecting system.  The bladder was drained and the scope was removed.  A dual-lumen access catheter was passed over the wire to provide dilation and a second safety wire was added.  We attempted to pass a 12/14 [place] ureteral access  sheath however continued to meet resistance at the ureterovesical junction and this was aborted.  The disposable digital flexible ureteroscope was then advanced over the working wire and passed easily up to the level of the stone.  The 273 m laser fiber was advanced to the scope and on settings of 0.5 J and 20 Hz the stone was dusted into sub-1 mm fragments.  Pyeloscopy did not demonstrate any residual fragments.  10 cc of contrast was injected into the collecting system to aid in stent placement.  Pullback ureteroscopy did not demonstrate any concerning findings in the ureter.  A 6 French by 24 cm stent was then placed over the wire by hand.  A good curl was noted in the renal pelvis and the bladder on fluoroscopy.  Disposition: Stable to PACU  Plan: Stent removal in clinic in 10 to 14 days   Nickolas Madrid, MD

## 2017-09-16 NOTE — Anesthesia Post-op Follow-up Note (Signed)
Anesthesia QCDR form completed.        

## 2017-09-16 NOTE — Discharge Instructions (Signed)
  AMBULATORY SURGERY  DISCHARGE INSTRUCTIONS   1) The drugs that you were given will stay in your system until tomorrow so for the next 24 hours you should not:  A) Drive an automobile B) Make any legal decisions C) Drink any alcoholic beverage   2) You may resume regular meals tomorrow.  Today it is better to start with liquids and gradually work up to solid foods.  You may eat anything you prefer, but it is better to start with liquids, then soup and crackers, and gradually work up to solid foods.   3) Please notify your doctor immediately if you have any unusual bleeding, trouble breathing, redness and pain at the surgery site, drainage, fever, or pain not relieved by medication.    4) Additional Instructions: TAKE A STOOL SOFTENER TWICE A DAY WHILE TAKING NARCOTIC PAIN MEDICINE TO PREVENT CONSTIPATION   Please contact your physician with any problems or Same Day Surgery at [phone], Monday through Friday 6 am to 4 pm, or [facility] Main number at [phone].   

## 2017-09-16 NOTE — Transfer of Care (Signed)
Immediate Anesthesia Transfer of Care Note  Patient: Kristen Atkins  Procedure(s) Performed: Procedure(s): CYSTOSCOPY/URETEROSCOPY/HOLMIUM LASER/STENT PLACEMENT (Right)  Patient Location: PACU  Anesthesia Type:General  Level of Consciousness: sedated  Airway & Oxygen Therapy: Patient Spontanous Breathing and Patient connected to face mask oxygen  Post-op Assessment: Report given to RN and Post -op Vital signs reviewed and stable  Post vital signs: Reviewed and stable  Last Vitals:  Vitals:   09/16/17 0812 09/16/17 1047  BP: 133/90 132/86  Pulse: 81 76  Resp: 16 (!) 28  Temp: (!) 35.9 C   SpO2: 32% 34%    Complications: No apparent anesthesia complications

## 2017-09-18 NOTE — Anesthesia Postprocedure Evaluation (Signed)
Anesthesia Post Note  Patient: Kristen Atkins  Procedure(s) Performed: CYSTOSCOPY/URETEROSCOPY/HOLMIUM LASER/STENT PLACEMENT (Right )  Patient location during evaluation: PACU Anesthesia Type: General Level of consciousness: awake and alert Pain management: pain level controlled Vital Signs Assessment: post-procedure vital signs reviewed and stable Respiratory status: spontaneous breathing, nonlabored ventilation, respiratory function stable and patient connected to face mask oxygen Cardiovascular status: blood pressure returned to baseline and stable Postop Assessment: no apparent nausea or vomiting Anesthetic complications: no     Last Vitals:  Vitals:   09/16/17 1202 09/16/17 1227  BP: (!) 147/91 (!) 150/94  Pulse: 73 70  Resp: 18 16  Temp: 36.6 C   SpO2: 95% 97%    Last Pain:  Vitals:   09/16/17 1227  TempSrc:   PainSc: [place]

## 2017-10-03 ENCOUNTER — Encounter: Payer: Self-pay | Admitting: Urology

## 2017-10-03 ENCOUNTER — Ambulatory Visit (INDEPENDENT_AMBULATORY_CARE_PROVIDER_SITE_OTHER): Payer: Managed Care, Other (non HMO) | Admitting: Urology

## 2017-10-03 VITALS — BP 138/89 | HR 96

## 2017-10-03 DIAGNOSIS — N200 Calculus of kidney: Secondary | ICD-10-CM | POA: Diagnosis not present

## 2017-10-03 LAB — URINALYSIS, COMPLETE
BILIRUBIN UA: NEGATIVE
Glucose, UA: NEGATIVE
KETONES UA: NEGATIVE
Nitrite, UA: NEGATIVE
Protein, UA: NEGATIVE
SPEC GRAV UA: 1.02 (ref 1.005–1.030)
Urobilinogen, Ur: 0.2 mg/dL (ref 0.2–1.0)
pH, UA: 7 (ref 5.0–7.5)

## 2017-10-03 LAB — MICROSCOPIC EXAMINATION

## 2017-10-03 MED ORDER — CIPROFLOXACIN HCL 500 MG PO TABS
500.0000 mg | ORAL_TABLET | Freq: Once | ORAL | Status: AC
  Administered 2017-10-03: 500 mg via ORAL

## 2017-10-03 MED ORDER — LIDOCAINE HCL URETHRAL/MUCOSAL 2 % EX GEL
1.0000 "application " | Freq: Once | CUTANEOUS | Status: AC
  Administered 2017-10-03: 1 via URETHRAL

## 2017-10-03 NOTE — Progress Notes (Signed)
Cystoscopy Procedure Note:  Indication: Stent removal s/p 8/30 RIGHT URS/LL/stent for 1.4cm renal pelvis stone  After informed consent and discussion of the procedure and its risks, Kristen Atkins was positioned and prepped in the standard fashion. Cystoscopy was performed with a flexible cystoscope. The stent was grasped and removed in its entirety.  Findings: Uncomplicated stent removal  Assessment and Plan: Follow up in 4 weeks with renal ultrasound to evaluate for silent hydronephrosis  Billey Co, MD 10/03/2017

## 2017-11-15 ENCOUNTER — Ambulatory Visit: Payer: Managed Care, Other (non HMO) | Admitting: Urology

## 2019-10-01 IMAGING — US US TRANSVAGINAL NON-OB
1 series · 13 of 25 positions shown · non-contrast
Comparison: None

CLINICAL DATA: Patient with history of menorrhagia. Prior right
oophorectomy.



[Series 1: us transvaginal non-ob · 0.24mm/px · 13 of 53 slices shown]
[im 1/53]
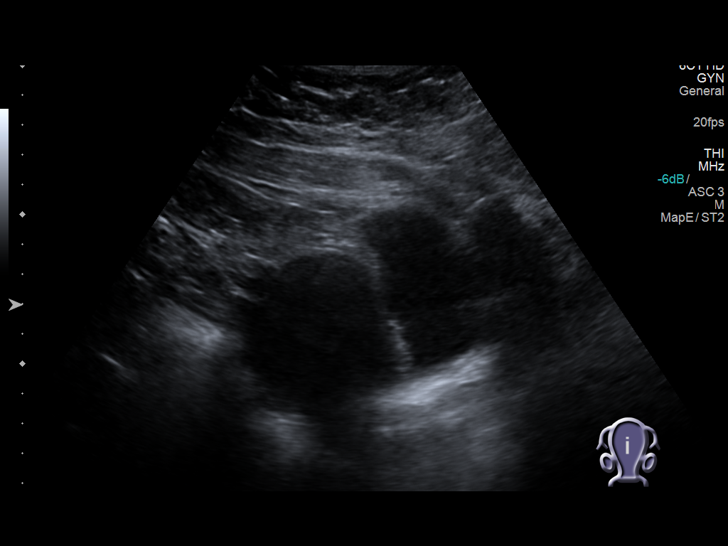
[im 5/53]
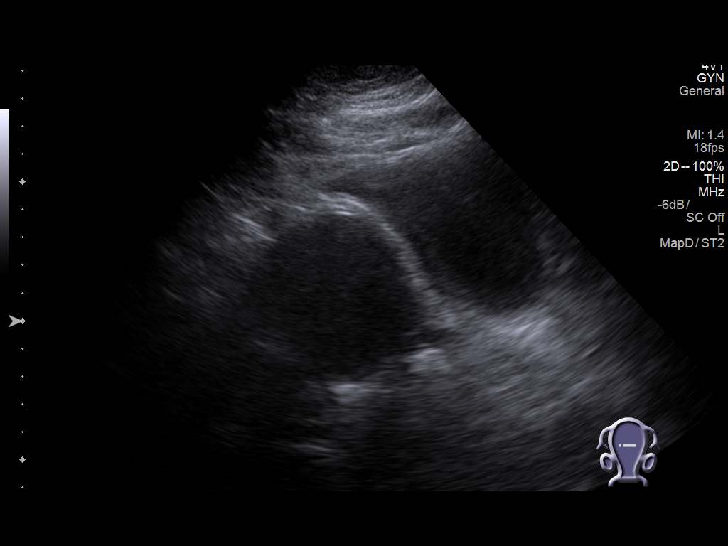
[im 9/53]
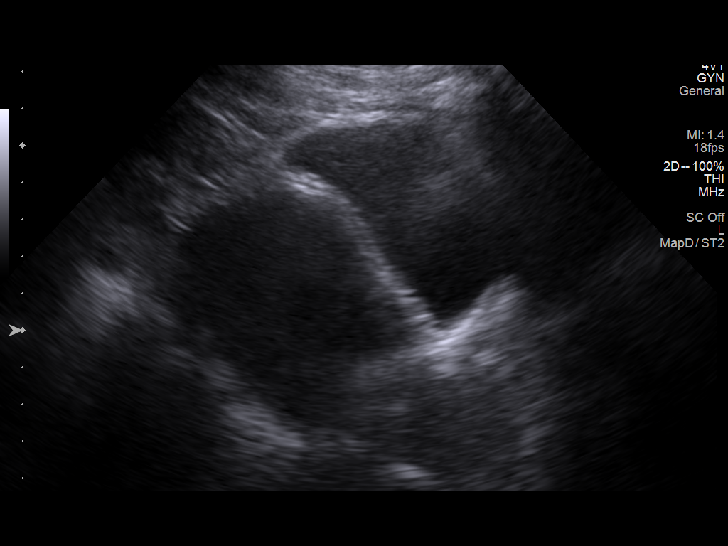
[im 14/53]
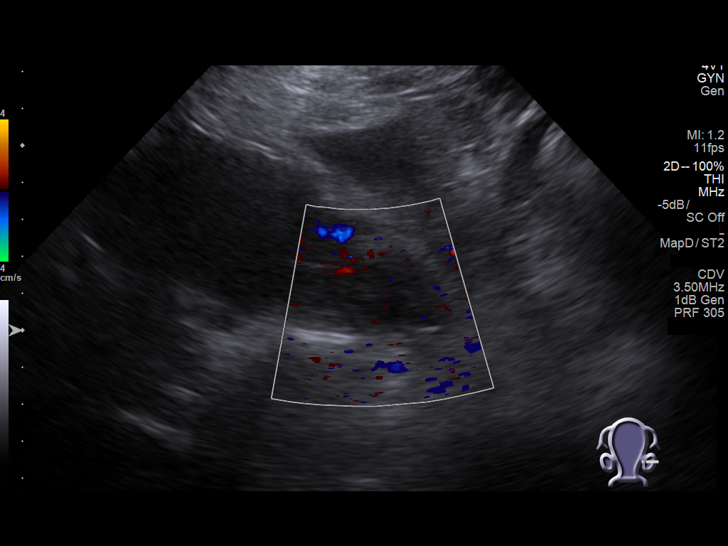
[im 18/53]
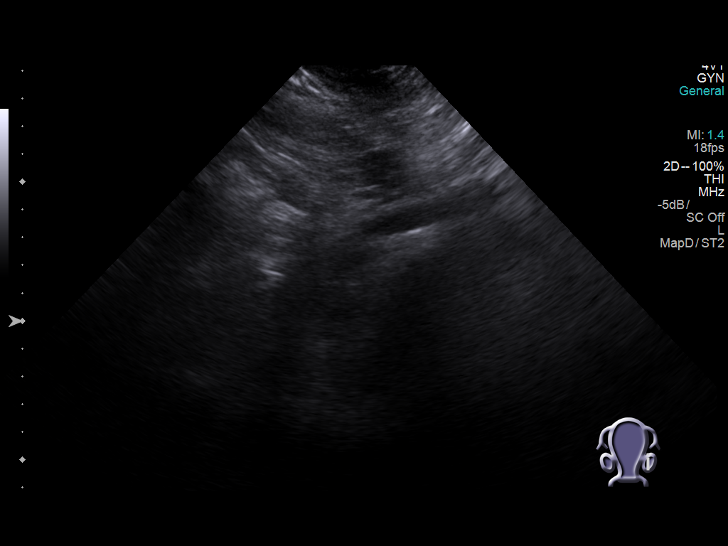
[im 22/53]
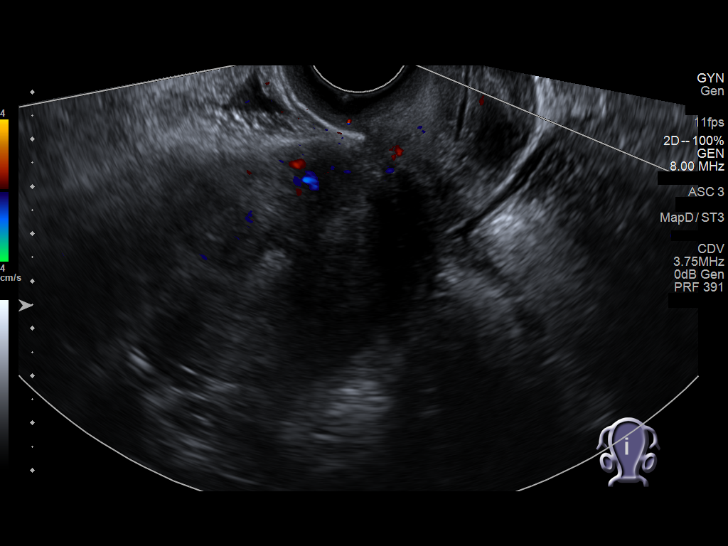
[im 27/53]
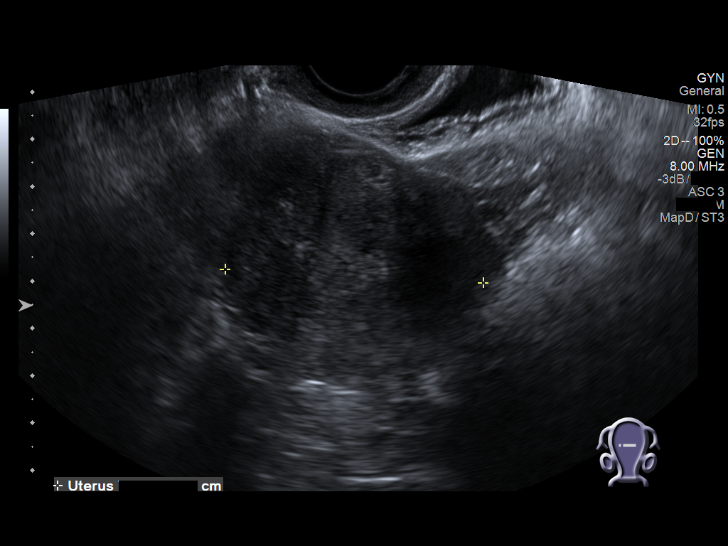
[im 31/53]
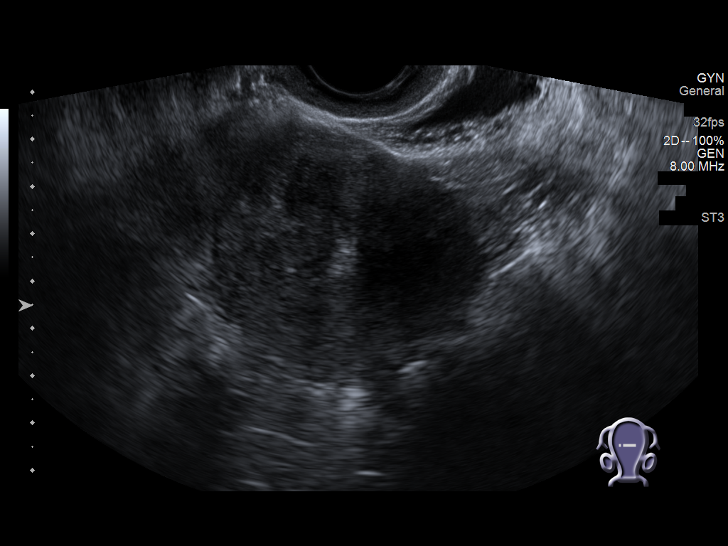
[im 35/53]
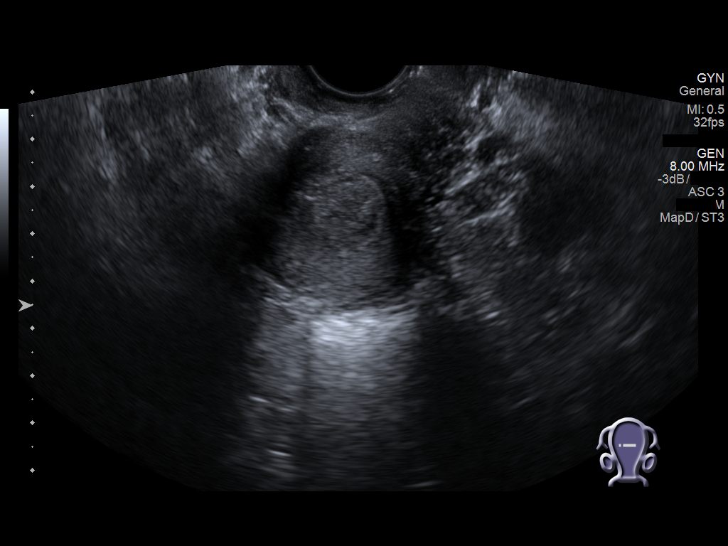
[im 40/53]
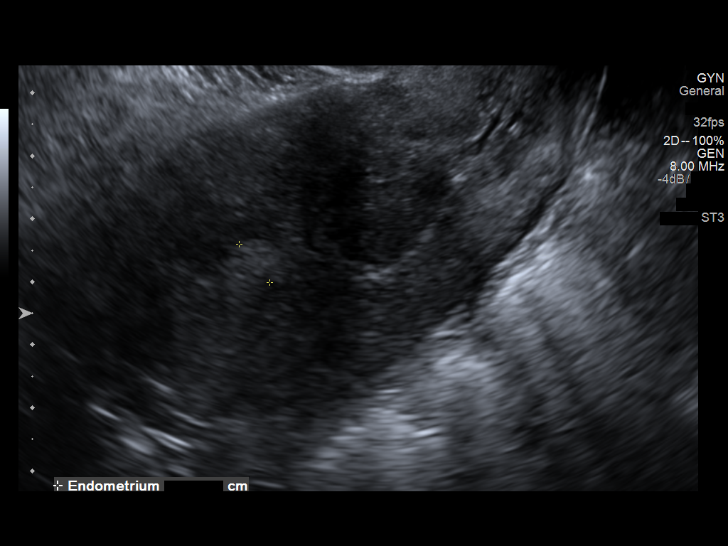
[im 44/53]
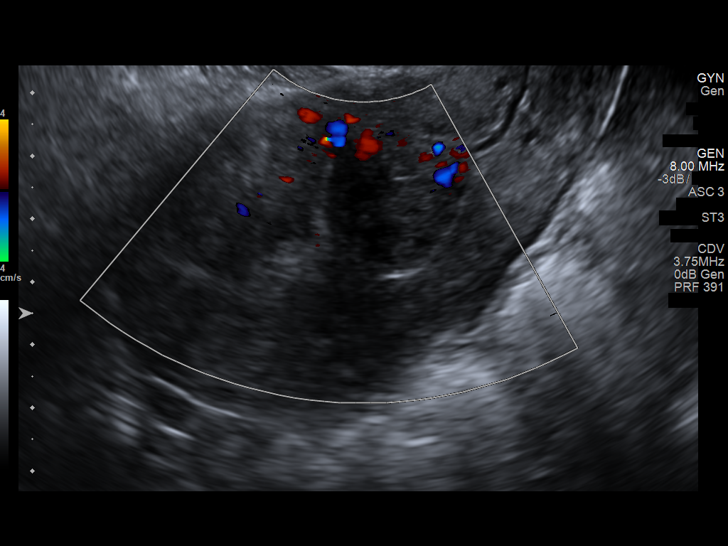
[im 48/53]
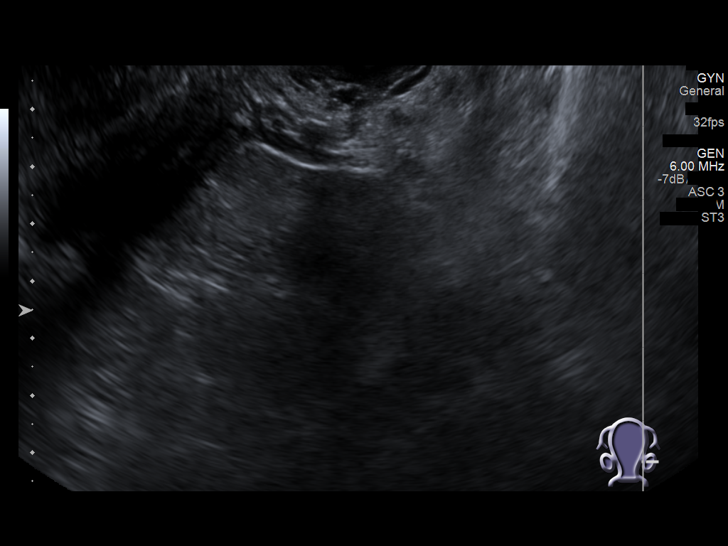
[im 53/53]
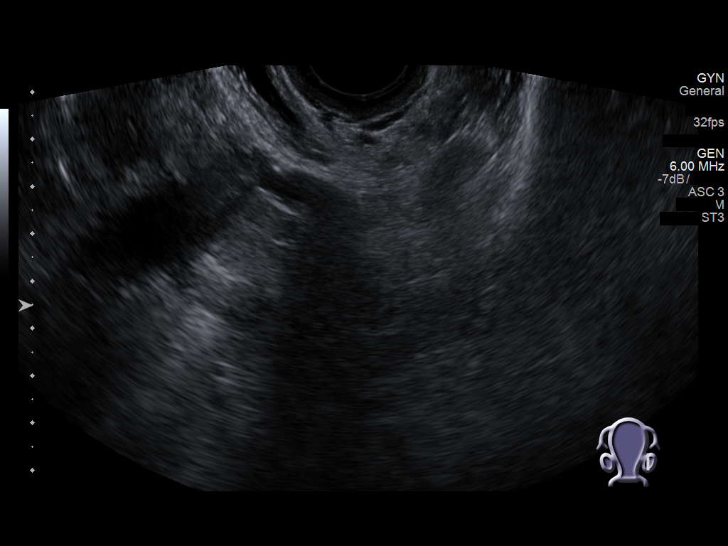

[13 of 25 positions shown; findings below may reference images not displayed]

FINDINGS: Uterus

Measurements: 10.4 x 5.6 x 5.5 cm. Within the lower uterine segment
there is a 3.0 x 2.5 x 3.5 cm hypoechoic mass.

Endometrium

Thickness: 8 mm. Hypoechoic mass measuring 3.0 x 2.5 x 3.5 cm within
the lower uterine segment which may the impressing upon or within
the endometrial canal.

Right ovary

Surgically absent

Left ovary

Measurements: 2.9 x 2.6 x 3.2 cm. Normal appearance/no adnexal mass.

Other findings

No abnormal free fluid.
IMPRESSION: There is a 3.5 cm hypoechoic mass within the lower uterine segment
which may either be within the endometrium or impressing upon the
endometrium. Considerations include large submucosal fibroid or
endometrial mass. Further evaluation can be obtained with
sonohysterography or pelvic MRI.

## 2019-12-11 IMAGING — CT CT RENAL STONE PROTOCOL
2 of 4 series · 15 of 46 positions shown, 17 images · non-contrast
Comparison: Pelvis ultrasound 07/12/2017

CLINICAL DATA: 41-year-old female with intermittent right flank
pain, but progressive following uterine ablation earlier this month.

EXAM:
CT ABDOMEN AND PELVIS WITHOUT CONTRAST
TECHNIQUE: Multidetector CT imaging of the abdomen and pelvis was performed
following the standard protocol without IV contrast.

[Series 2: axial st · axial · 0.75mm/px · z∈[-477,-57]mm · 12 of 92 slices shown, 14 images]
[im 4/92  soft-tissue]
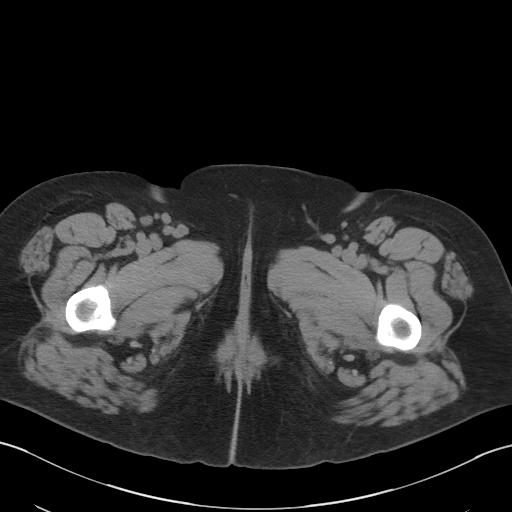
[im 4/92  bone]
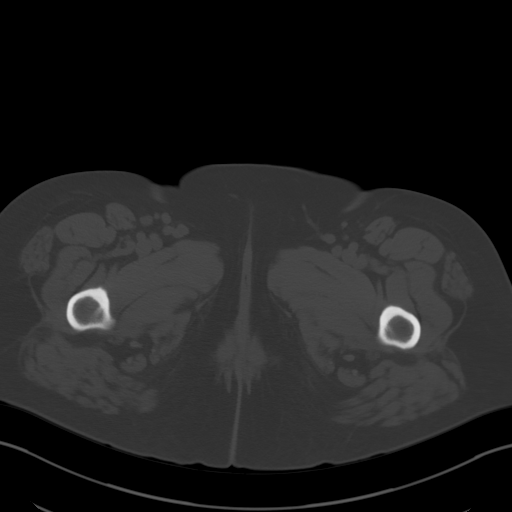
[im 12/92  soft-tissue]
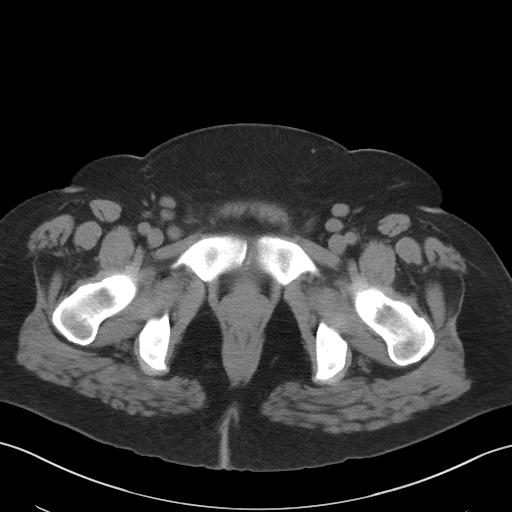
[im 19/92  soft-tissue]
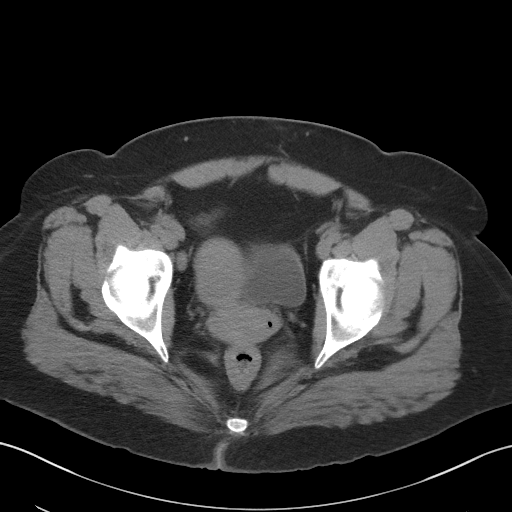
[im 27/92  soft-tissue]
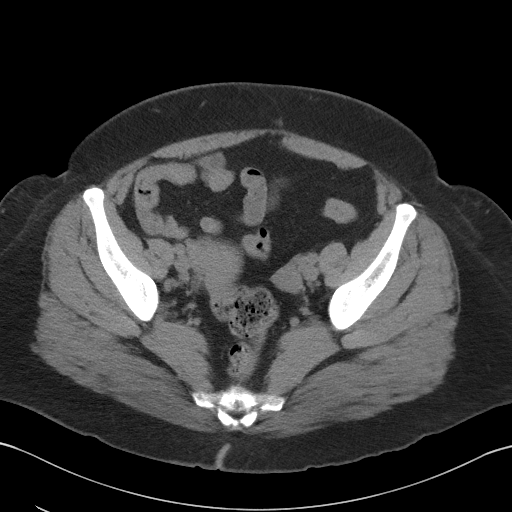
[im 35/92  soft-tissue]
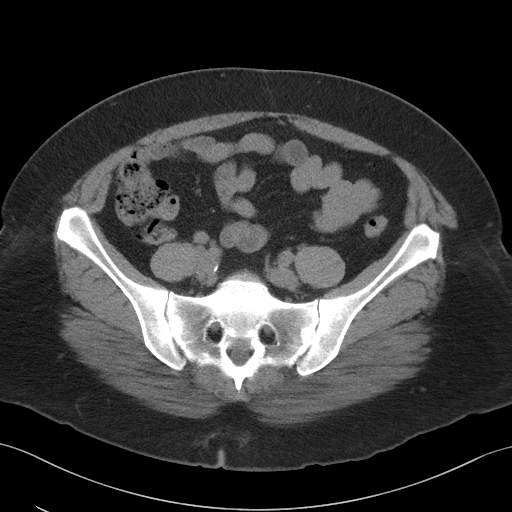
[im 42/92  soft-tissue]
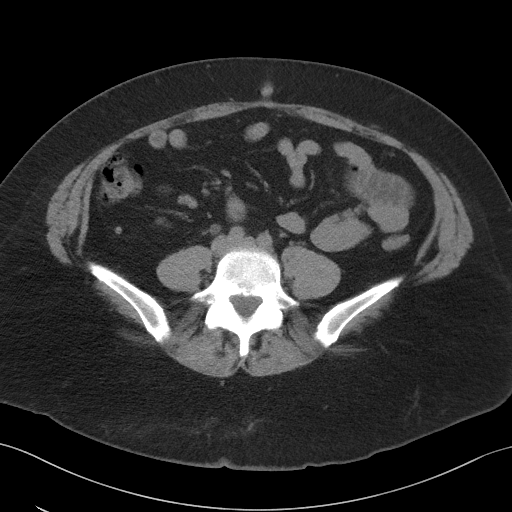
[im 50/92  soft-tissue]
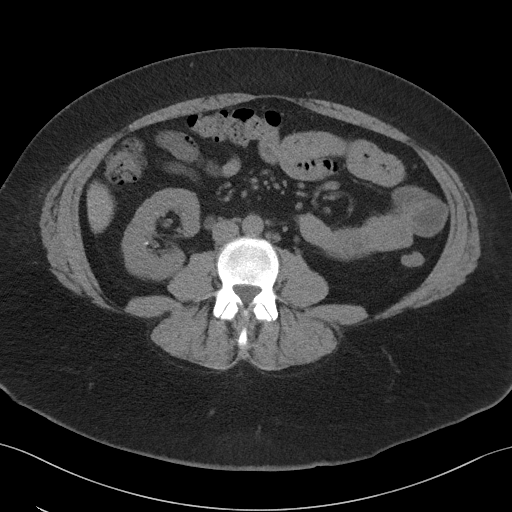
[im 57/92  soft-tissue]
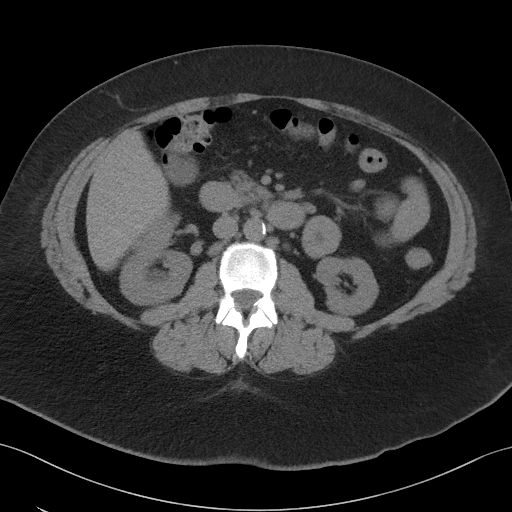
[im 65/92  soft-tissue]
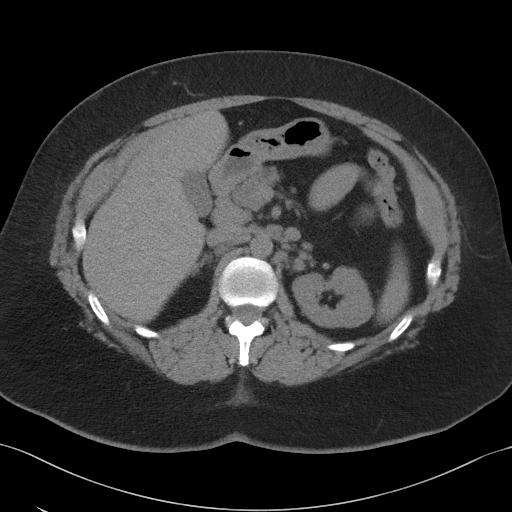
[im 65/92  bone]
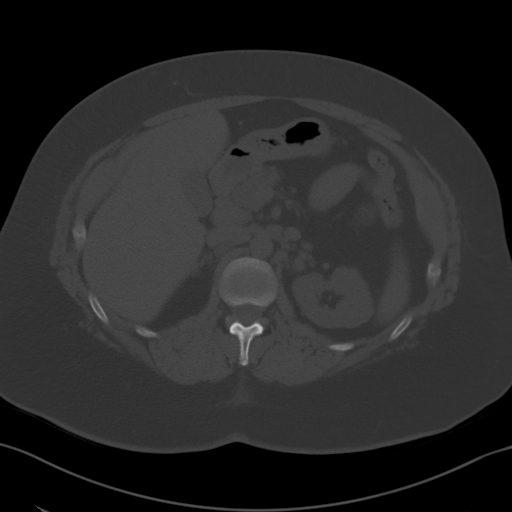
[im 73/92  soft-tissue]
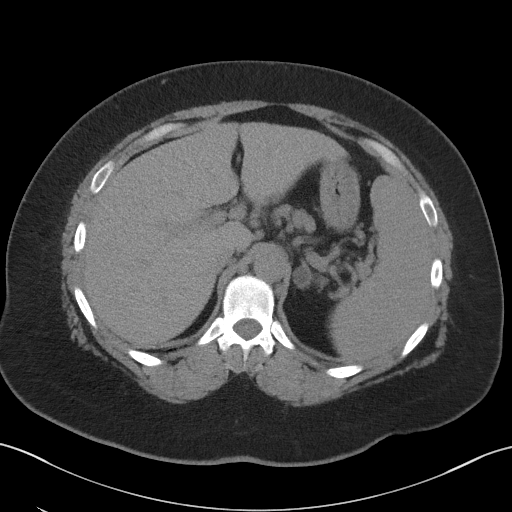
[im 80/92  soft-tissue]
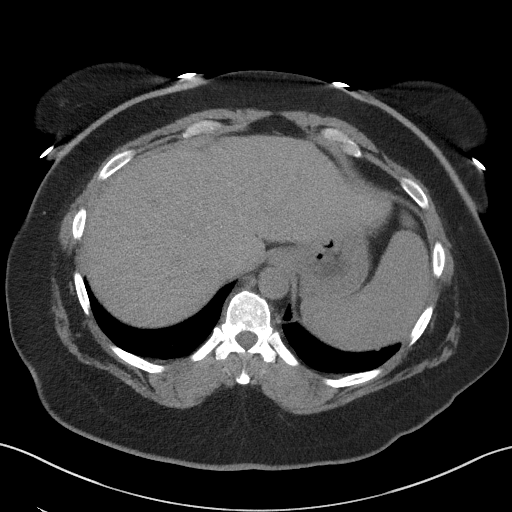
[im 88/92  soft-tissue]
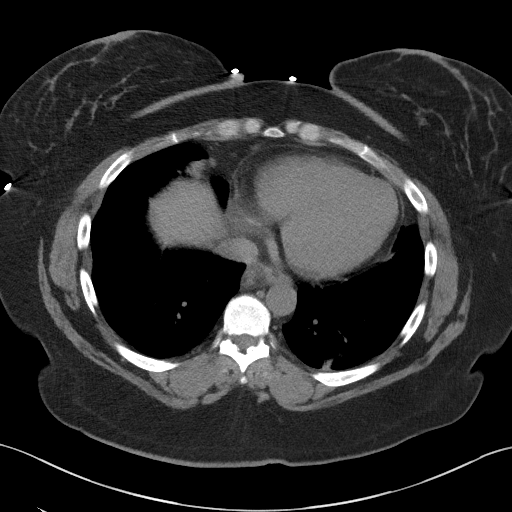

[Series 5: coronal st · coronal · 0.77mm/px · 3 of 102 slices shown]
[im 34/102  soft-tissue]
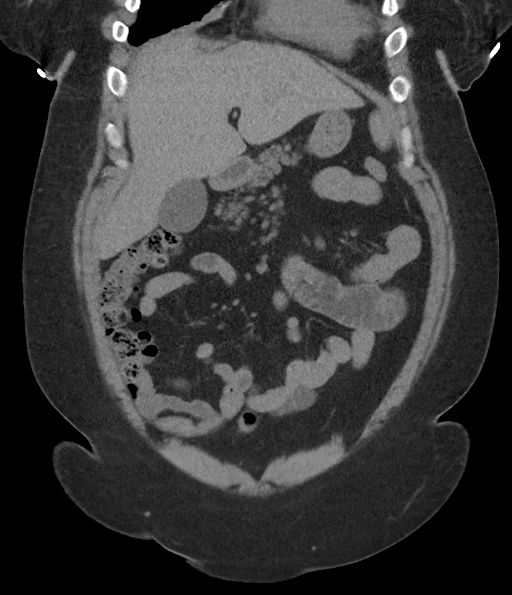
[im 45/102  soft-tissue]
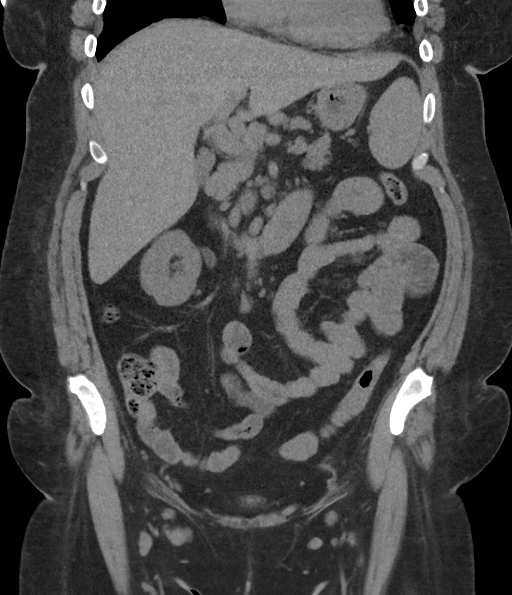
[im 57/102  soft-tissue]
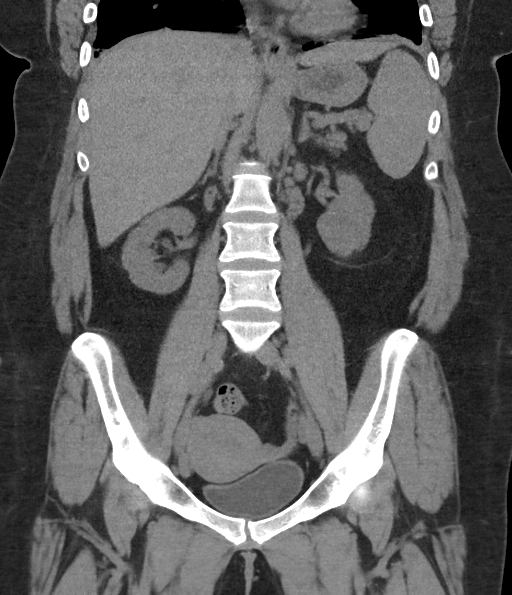

[15 of 46 positions shown; findings below may reference images not displayed]

FINDINGS: Lower chest: Cardiac size is at the upper limits of normal. Mild
lung base atelectasis, less likely scarring. No pleural effusion or
lung base consolidation.

Hepatobiliary: Negative noncontrast liver and gallbladder.

Pancreas: Negative.

Spleen: Negative.

Adrenals/Urinary Tract: Round 14 millimeter low-density (-5
Hounsfield units) left adrenal benign adenoma. Normal right adrenal
gland.

Negative noncontrast left kidney and course of the left ureter.

Diminutive and unremarkable urinary bladder.

Large oval 14 x 10 millimeter calculus within the right renal
pelvis. Superimposed punctate lower pole nephrolithiasis. Asymmetric
enlargement of the right renal calices without overt hydronephrosis
or perinephric stranding. The proximal right ureter is
asymmetrically larger than the left but no obstructing etiology is
identified along the ureter course to the urinary bladder. No
periureteral stranding.

Stomach/Bowel: Negative rectum aside from retained stool.
Decompressed and negative descending and sigmoid colon. Negative
transverse colon, right colon, and retrocecal appendix. Negative
terminal ileum.

Small bowel loops are within normal limits. Decompressed stomach and
duodenum. No abdominal free air, free fluid.

Vascular/Lymphatic: Mild Aortoiliac calcified atherosclerosis.
Vascular patency is not evaluated in the absence of IV contrast.

Upper limits of normal upper abdominal lymph nodes about the SMA and
both kidneys. Individual nodes in this region measure 9-10
millimeters short axis.

Similar maximal common iliac artery lymph nodes bilaterally, also
increased in number.

Similar maximal inguinal lymph nodes measuring up to 14 millimeters
in short axis and increased in number. No bulky or necrotic lymph
nodes are identified.

Reproductive: Normal noncontrast appearance of the uterus and
ovaries.

Other: No pelvic free fluid.

Musculoskeletal: Benign appearing bone islands in the right iliac
wing. Otherwise negative.
IMPRESSION: 1. Bulky renal calculus within the right renal pelvis measures 14 x
10 mm and may be mildly obstructing the right renal calices.
Multiple punctate right lower pole calculi.
2. Non-specific mild lymphadenopathy in the abdomen and pelvis.
Currently this has a benign appearance, with top differential
considerations including sarcoidosis or other benign
lymphoproliferative disorder. Leukemia or lymphoma are less likely.
Recommend a follow-up CT abdomen and pelvis with IV contrast in 3-6
months to document stability.

## 2022-07-06 ENCOUNTER — Other Ambulatory Visit
Admission: RE | Admit: 2022-07-06 | Discharge: 2022-07-06 | Disposition: A | Discharge status: Home / Self Care | Payer: Managed Care, Other (non HMO) | Source: Ambulatory Visit | Attending: Rheumatology | Admitting: Rheumatology

## 2022-07-06 DIAGNOSIS — D869 Sarcoidosis, unspecified: Secondary | ICD-10-CM | POA: Diagnosis present

## 2022-07-06 LAB — BRAIN NATRIURETIC PEPTIDE: B Natriuretic Peptide: 21 pg/mL (ref 0.0–100.0)

## 2022-09-22 ENCOUNTER — Ambulatory Visit: Payer: Managed Care, Other (non HMO) | Admitting: Dermatology

## 2022-10-13 ENCOUNTER — Other Ambulatory Visit: Payer: Self-pay | Admitting: Rheumatology

## 2022-10-13 DIAGNOSIS — R058 Other specified cough: Secondary | ICD-10-CM

## 2022-10-13 DIAGNOSIS — R0602 Shortness of breath: Secondary | ICD-10-CM
